# Patient Record
Sex: Male | Born: 1975 | Hispanic: Yes | Marital: Married | Smoking: Never smoker
Health system: Southern US, Community
[De-identification: ages and names within clinical notes are randomized; demographics above are authoritative.]

## PROBLEM LIST (undated history)

## (undated) DIAGNOSIS — E8881 Metabolic syndrome: Secondary | ICD-10-CM

## (undated) DIAGNOSIS — E785 Hyperlipidemia, unspecified: Secondary | ICD-10-CM

## (undated) DIAGNOSIS — K760 Fatty (change of) liver, not elsewhere classified: Secondary | ICD-10-CM

## (undated) HISTORY — DX: Metabolic syndrome: E88.810

## (undated) HISTORY — DX: Metabolic syndrome: E88.81

## (undated) HISTORY — DX: Fatty (change of) liver, not elsewhere classified: K76.0

## (undated) HISTORY — DX: Hyperlipidemia, unspecified: E78.5

---

## 2002-12-25 ENCOUNTER — Emergency Department (HOSPITAL_COMMUNITY): Admission: EM | Admit: 2002-12-25 | Discharge: 2002-12-25 | Payer: Self-pay | Admitting: Emergency Medicine

## 2007-05-20 ENCOUNTER — Emergency Department (HOSPITAL_COMMUNITY): Admission: EM | Admit: 2007-05-20 | Discharge: 2007-05-20 | Payer: Self-pay | Admitting: Emergency Medicine

## 2008-06-02 ENCOUNTER — Encounter: Admission: RE | Admit: 2008-06-02 | Discharge: 2008-06-02 | Payer: Self-pay | Admitting: Family Medicine

## 2008-06-09 ENCOUNTER — Encounter: Admission: RE | Admit: 2008-06-09 | Discharge: 2008-07-20 | Payer: Self-pay | Admitting: Family Medicine

## 2008-08-28 ENCOUNTER — Encounter: Admission: RE | Admit: 2008-08-28 | Discharge: 2008-08-28 | Payer: Self-pay | Admitting: Family Medicine

## 2014-02-27 ENCOUNTER — Encounter (HOSPITAL_COMMUNITY): Payer: Self-pay | Admitting: Emergency Medicine

## 2014-02-27 ENCOUNTER — Emergency Department (HOSPITAL_COMMUNITY)
Admission: EM | Admit: 2014-02-27 | Discharge: 2014-02-27 | Disposition: A | Discharge status: Home / Self Care | Payer: Medicaid Other | Attending: Emergency Medicine | Admitting: Emergency Medicine

## 2014-02-27 ENCOUNTER — Emergency Department (HOSPITAL_COMMUNITY): Payer: Medicaid Other

## 2014-02-27 DIAGNOSIS — K5790 Diverticulosis of intestine, part unspecified, without perforation or abscess without bleeding: Secondary | ICD-10-CM | POA: Diagnosis not present

## 2014-02-27 DIAGNOSIS — K5792 Diverticulitis of intestine, part unspecified, without perforation or abscess without bleeding: Secondary | ICD-10-CM

## 2014-02-27 DIAGNOSIS — R000 Tachycardia, unspecified: Secondary | ICD-10-CM | POA: Diagnosis not present

## 2014-02-27 DIAGNOSIS — R1030 Lower abdominal pain, unspecified: Secondary | ICD-10-CM

## 2014-02-27 LAB — CBC WITH DIFFERENTIAL/PLATELET
BASOS ABS: 0 10*3/uL (ref 0.0–0.1)
Basophils Relative: 0 % (ref 0–1)
EOS PCT: 0 % (ref 0–5)
Eosinophils Absolute: 0 10*3/uL (ref 0.0–0.7)
HCT: 47.6 % (ref 39.0–52.0)
Hemoglobin: 16.8 g/dL (ref 13.0–17.0)
LYMPHS ABS: 0.9 10*3/uL (ref 0.7–4.0)
LYMPHS PCT: 8 % — AB (ref 12–46)
MCH: 32.2 pg (ref 26.0–34.0)
MCHC: 35.3 g/dL (ref 30.0–36.0)
MCV: 91.4 fL (ref 78.0–100.0)
Monocytes Absolute: 0.5 10*3/uL (ref 0.1–1.0)
Monocytes Relative: 5 % (ref 3–12)
NEUTROS PCT: 87 % — AB (ref 43–77)
Neutro Abs: 9.8 10*3/uL — ABNORMAL HIGH (ref 1.7–7.7)
PLATELETS: 150 10*3/uL (ref 150–400)
RBC: 5.21 MIL/uL (ref 4.22–5.81)
RDW: 12.2 % (ref 11.5–15.5)
WBC: 11.2 10*3/uL — AB (ref 4.0–10.5)

## 2014-02-27 LAB — URINALYSIS, ROUTINE W REFLEX MICROSCOPIC
Bilirubin Urine: NEGATIVE
GLUCOSE, UA: NEGATIVE mg/dL
Hgb urine dipstick: NEGATIVE
KETONES UR: NEGATIVE mg/dL
LEUKOCYTES UA: NEGATIVE
NITRITE: NEGATIVE
PH: 6 (ref 5.0–8.0)
Protein, ur: NEGATIVE mg/dL
SPECIFIC GRAVITY, URINE: 1.009 (ref 1.005–1.030)
Urobilinogen, UA: 1 mg/dL (ref 0.0–1.0)

## 2014-02-27 LAB — HEPATIC FUNCTION PANEL
ALBUMIN: 4.6 g/dL (ref 3.5–5.2)
ALK PHOS: 93 U/L (ref 39–117)
ALT: 73 U/L — ABNORMAL HIGH (ref 0–53)
AST: 35 U/L (ref 0–37)
BILIRUBIN TOTAL: 2.8 mg/dL — AB (ref 0.3–1.2)
Bilirubin, Direct: 0.4 mg/dL — ABNORMAL HIGH (ref 0.0–0.3)
Indirect Bilirubin: 2.4 mg/dL — ABNORMAL HIGH (ref 0.3–0.9)
TOTAL PROTEIN: 8.3 g/dL (ref 6.0–8.3)

## 2014-02-27 LAB — LIPASE, BLOOD: LIPASE: 28 U/L (ref 11–59)

## 2014-02-27 LAB — I-STAT CHEM 8, ED
BUN: 10 mg/dL (ref 6–23)
CREATININE: 0.9 mg/dL (ref 0.50–1.35)
Calcium, Ion: 1.13 mmol/L (ref 1.12–1.23)
Chloride: 101 mEq/L (ref 96–112)
Glucose, Bld: 111 mg/dL — ABNORMAL HIGH (ref 70–99)
HCT: 50 % (ref 39.0–52.0)
HEMOGLOBIN: 17 g/dL (ref 13.0–17.0)
POTASSIUM: 3.5 meq/L — AB (ref 3.7–5.3)
SODIUM: 141 meq/L (ref 137–147)
TCO2: 24 mmol/L (ref 0–100)

## 2014-02-27 MED ORDER — OXYCODONE-ACETAMINOPHEN 5-325 MG PO TABS: 2.0000 | ORAL_TABLET | Freq: Four times a day (QID) | ORAL | Status: DC | PRN

## 2014-02-27 MED ORDER — OXYCODONE-ACETAMINOPHEN 5-325 MG PO TABS
2.0000 | ORAL_TABLET | Freq: Once | ORAL | Status: AC
  Administered 2014-02-27: 2 via ORAL

## 2014-02-27 MED ORDER — MORPHINE SULFATE 4 MG/ML IJ SOLN
4.0000 mg | Freq: Once | INTRAMUSCULAR | Status: AC
  Administered 2014-02-27: 4 mg via INTRAVENOUS

## 2014-02-27 MED ORDER — CIPROFLOXACIN IN D5W 400 MG/200ML IV SOLN
400.0000 mg | Freq: Once | INTRAVENOUS | Status: AC
  Administered 2014-02-27: 400 mg via INTRAVENOUS

## 2014-02-27 MED ORDER — IOHEXOL 300 MG/ML  SOLN
100.0000 mL | Freq: Once | INTRAMUSCULAR | Status: AC | PRN
  Administered 2014-02-27: 100 mL via INTRAVENOUS

## 2014-02-27 MED ORDER — SODIUM CHLORIDE 0.9 % IV BOLUS (SEPSIS)
1000.0000 mL | Freq: Once | INTRAVENOUS | Status: AC
  Administered 2014-02-27: 1000 mL via INTRAVENOUS

## 2014-02-27 MED ORDER — METRONIDAZOLE IN NACL 5-0.79 MG/ML-% IV SOLN
500.0000 mg | Freq: Once | INTRAVENOUS | Status: AC
  Administered 2014-02-27: 500 mg via INTRAVENOUS

## 2014-02-27 MED ORDER — ONDANSETRON HCL 4 MG/2ML IJ SOLN
4.0000 mg | Freq: Once | INTRAMUSCULAR | Status: AC
  Administered 2014-02-27: 4 mg via INTRAVENOUS

## 2014-02-27 MED ORDER — IOHEXOL 300 MG/ML  SOLN
50.0000 mL | Freq: Once | INTRAMUSCULAR | Status: AC | PRN
  Administered 2014-02-27: 50 mL via ORAL

## 2014-02-27 MED ORDER — IBUPROFEN 800 MG PO TABS
800.0000 mg | ORAL_TABLET | Freq: Once | ORAL | Status: AC
  Administered 2014-02-27: 800 mg via ORAL

## 2014-02-27 MED ORDER — METRONIDAZOLE 500 MG PO TABS: 500.0000 mg | ORAL_TABLET | Freq: Two times a day (BID) | ORAL | Status: DC

## 2014-02-27 MED ORDER — CIPROFLOXACIN HCL 500 MG PO TABS: 500.0000 mg | ORAL_TABLET | Freq: Two times a day (BID) | ORAL | Status: DC

## 2014-02-27 NOTE — ED Provider Notes (Signed)
CSN: [id number]     Arrival date & time 02/27/14  1323 History   First MD Initiated Contact with Patient 02/27/14 1345     Chief Complaint  Patient presents with  . Abdominal Pain  . sent from PCP to rule out appendicitis      (Consider location/radiation/quality/duration/timing/severity/associated sxs/prior Treatment) HPI   38 year old male who was sent here from PCP office for evaluation of low abdominal pain. Patient reports gradual onset of low abdominal pain for the past 2 days. She described pain as a sharp achy sensation, waxing waning but intensified last night. Pain is severe and he was having difficulty sleeping. He attempted to have a bowel movement with minimal improvement. He felt nauseous but without vomiting. He tries taking ibuprofen which does alleviate his pain. He reports subjective fever, chills, and decrease in appetite. He denies any recent injury. He denies dysuria, penile discharge, scrotal pain, or rash. He was seen at his PCP office this morning and was recommended to come to the ER for further care. No prior history of abdominal surgery.  History reviewed. No pertinent past medical history. History reviewed. No pertinent past surgical history. No family history on file. History  Substance Use Topics  . Smoking status: Never Smoker   . Smokeless tobacco: Not on file  . Alcohol Use: No    Review of Systems  All other systems reviewed and are negative.     Allergies  Review of patient's allergies indicates not on file.  Home Medications   Prior to Admission medications   Not on File   BP 117/76 mmHg  Pulse 107  Temp(Src) 97.5 F (36.4 C) (Oral)  Resp 20  Ht 5\' 4"  (1.626 m)  Wt 195 lb (88.451 kg)  BMI 33.46 kg/m2  SpO2 99% Physical Exam  Constitutional: He appears well-developed and well-nourished. No distress.  HENT:  Head: Atraumatic.  Eyes: Conjunctivae are normal.  Neck: Normal range of motion. Neck supple.  Cardiovascular:   Tachycardia without murmurs rubs or gallops  Pulmonary/Chest: Effort normal and breath sounds normal.  Abdominal: Soft. Bowel sounds are normal. He exhibits no distension. There is tenderness (Suprapubic abdominal tenderness on palpation with guarding but without rebound tenderness). Hernia confirmed negative in the right inguinal area and confirmed negative in the left inguinal area.  Negative Murphy's and no pain at McBurney's point.  Genitourinary: Testes normal and penis normal. Uncircumcised.  No CVA tenderness  Neurological: He is alert.  Skin: No rash noted.  Psychiatric: He has a normal mood and affect.    ED Course  Procedures (including critical care time)  1:56 PM Patient here with intense lower abdominal pain suggestive of appendicitis, colitis, diverticulitis. He reported his urine was checked at his doctor's office and was unremarkable. Will obtain CT scan for further evaluation. Patient is mildly tachycardic this is likely secondary to pain, he is afebrile. However IV fluid given.  3:56 PM Care discussed with oncoming provider who will determine disposition pending CT result.    Labs Review Labs Reviewed  CBC WITH DIFFERENTIAL - Abnormal; Notable for the following:    WBC 11.2 (*)    Neutrophils Relative % 87 (*)    Neutro Abs 9.8 (*)    Lymphocytes Relative 8 (*)    All other components within normal limits  HEPATIC FUNCTION PANEL - Abnormal; Notable for the following:    ALT 73 (*)    Total Bilirubin 2.8 (*)    Bilirubin, Direct 0.4 (*)  Indirect Bilirubin 2.4 (*)    All other components within normal limits  I-STAT CHEM 8, ED - Abnormal; Notable for the following:    Potassium 3.5 (*)    Glucose, Bld 111 (*)    All other components within normal limits  LIPASE, BLOOD  URINALYSIS, ROUTINE W REFLEX MICROSCOPIC  I-STAT CREATININE, ED    Imaging Review Ct Abdomen Pelvis W Contrast  02/27/2014   CLINICAL DATA:  38 year old male with history of right lower  quadrant abdominal pain which began last night, also complaining of diarrhea. Evaluate for potential acute appendicitis.  EXAM: CT ABDOMEN AND PELVIS WITH CONTRAST  TECHNIQUE: Multidetector CT imaging of the abdomen and pelvis was performed using the standard protocol following bolus administration of intravenous contrast.  CONTRAST:  50mL OMNIPAQUE IOHEXOL 300 MG/ML SOLN, 100mL OMNIPAQUE IOHEXOL 300 MG/ML SOLN  COMPARISON:  No priors.  FINDINGS: Lower chest:  Unremarkable.  Hepatobiliary: No cystic or solid hepatic lesions. No intra or extrahepatic biliary ductal dilatation. Gallbladder is normal in appearance.  Pancreas: Unremarkable.  Spleen: Unremarkable.  Adrenals/Urinary Tract: There are 2 low-attenuation lesions in the kidneys, largest of which measures 1.9 cm in the anterior aspect of the lower pole of the right kidney, both of which are compatible with small simple cysts. No hydroureteronephrosis. Urinary bladder is normal in appearance. Bilateral adrenal glands are normal at and appearance.  Stomach/Bowel: Normal appendix. However, there is severe mass-like thickening of the distal sigmoid colon, best appreciated on images 71-75 of series 2, where there is extensive surrounding inflammatory changes in the adjacent sigmoid mesocolon. A few tiny colonic diverticulae are noted in the colon, suggesting that this could be related to acute diverticulitis. No definite diverticular abscess is identified at this time. The appearance of the stomach is normal. No pathologic dilatation of small bowel or colon.  Vascular/Lymphatic: No significant atherosclerotic disease within the abdominal or pelvic vasculature. No aneurysm or dissection. No lymphadenopathy noted in the abdomen or pelvis.  Reproductive: Prostate gland and seminal vesicles are unremarkable in appearance.  Other: Trace volume of ascites, presumably reactive. No pneumoperitoneum to suggest frank perforation of the bowel at this time.  Musculoskeletal:  There are no aggressive appearing lytic or blastic lesions noted in the visualized portions of the skeleton.  IMPRESSION: 1. Area of masslike thickening and surrounding inflammatory changes in the distal sigmoid colon. Given the presence of several diverticulae in this region, this is favored to represent an acute diverticulitis. This could alternatively be a focal area of severe colitis. However, given the mass like appearance of this region, the possibility of underlying neoplasm is not entirely excluded, and correlation with followup nonemergent colonoscopy after the patient's acute symptoms resolve following treatment is recommended in the near future. 2. Normal appendix. 3. Trace volume of ascites, presumably reactive. No diverticular abscess or signs of frank perforation of the bowel are noted at this time.   Electronically Signed   By: Trudie Reedaniel  Entrikin M.D.   On: 02/27/2014 16:07     EKG Interpretation None      MDM   Final diagnoses:  Lower abdominal pain  Diverticulitis of intestine without perforation or abscess without bleeding    BP 118/73 mmHg  Pulse 96  Temp(Src) 98.9 F (37.2 C) (Oral)  Resp 20  Ht 5\' 4"  (1.626 m)  Wt 195 lb (88.451 kg)  BMI 33.46 kg/m2  SpO2 95%  I have reviewed nursing notes and vital signs. I personally reviewed the imaging tests through PACS system  I reviewed available ER/hospitalization records thought the EMR     Fayrene HelperBowie Kekoa Fyock, Cordelia Poche-C 02/28/14 [id number]  Raeford RazorStephen Kohut, MD 03/06/14 [id number]

## 2014-02-27 NOTE — ED Provider Notes (Signed)
3:51 PM Patient signed out to me by Ardelle Parkran, PA-C.    Patient with RLQ pain since last night.  Se[facility] at [facility] and sent to ED for CT scan.  Plan:  CT abd.  Dispo pending CT results.  4:44 PM CT remarkable for probable acute diverticulitis.  Will treat with cipro/flagyl.  Will reassess.  If feeling better, patient can go home with GI follow-up and colonoscopy.  Discussed with Dr. Juleen ChinaKohut, who agrees with the plan.  Results for orders placed or performed during the hospital encounter of 02/27/14  CBC with Differential  Result Value Ref Range   WBC 11.2 (H) 4.0 - 10.5 K/uL   RBC 5.21 4.22 - 5.81 MIL/uL   Hemoglobin 16.8 13.0 - 17.0 g/dL   HCT 16.147.6 09.639.0 - 04.552.0 %   MCV 91.4 78.0 - 100.0 fL   MCH 32.2 26.0 - 34.0 pg   MCHC 35.3 30.0 - 36.0 g/dL   RDW 40.912.2 81.111.5 - 91.415.5 %   Platelets 150 150 - 400 K/uL   Neutrophils Relative % 87 (H) 43 - 77 %   Neutro Abs 9.8 (H) 1.7 - 7.7 K/uL   Lymphocytes Relative 8 (L) 12 - 46 %   Lymphs Abs 0.9 0.7 - 4.0 K/uL   Monocytes Relative 5 3 - 12 %   Monocytes Absolute 0.5 0.1 - 1.0 K/uL   Eosinophils Relative 0 0 - 5 %   Eosinophils Absolute 0.0 0.0 - 0.7 K/uL   Basophils Relative 0 0 - 1 %   Basophils Absolute 0.0 0.0 - 0.1 K/uL  Hepatic function panel  Result Value Ref Range   Total Protein 8.3 6.0 - 8.3 g/dL   Albumin 4.6 3.5 - 5.2 g/dL   AST 35 0 - 37 U/L   ALT 73 (H) 0 - 53 U/L   Alkaline Phosphatase 93 39 - 117 U/L   Total Bilirubin 2.8 (H) 0.3 - 1.2 mg/dL   Bilirubin, Direct 0.4 (H) 0.0 - 0.3 mg/dL   Indirect Bilirubin 2.4 (H) 0.3 - 0.9 mg/dL  Lipase, blood  Result Value Ref Range   Lipase 28 11 - 59 U/L  Urinalysis, Routine w reflex microscopic  Result Value Ref Range   Color, Urine YELLOW YELLOW   APPearance CLEAR CLEAR   Specific Gravity, Urine 1.009 1.005 - 1.030   pH 6.0 5.0 - 8.0   Glucose, UA NEGATIVE NEGATIVE mg/dL   Hgb urine dipstick NEGATIVE NEGATIVE   Bilirubin Urine NEGATIVE NEGATIVE   Ketones, ur NEGATIVE NEGATIVE mg/dL    Protein, ur NEGATIVE NEGATIVE mg/dL   Urobilinogen, UA 1.0 0.0 - 1.0 mg/dL   Nitrite NEGATIVE NEGATIVE   Leukocytes, UA NEGATIVE NEGATIVE   Ct Abdomen Pelvis W Contrast  02/27/2014   CLINICAL DATA:  38 year old male with history of right lower quadrant abdominal pain which began last night, also complaining of diarrhea. Evaluate for potential acute appendicitis.  EXAM: CT ABDOMEN AND PELVIS WITH CONTRAST  TECHNIQUE: Multidetector CT imaging of the abdomen and pelvis was performed using the standard protocol following bolus administration of intravenous contrast.  CONTRAST:  50mL OMNIPAQUE IOHEXOL 300 MG/ML SOLN, 100mL OMNIPAQUE IOHEXOL 300 MG/ML SOLN  COMPARISON:  No priors.  FINDINGS: Lower chest:  Unremarkable.  Hepatobiliary: No cystic or solid hepatic lesions. No intra or extrahepatic biliary ductal dilatation. Gallbladder is normal in appearance.  Pancreas: Unremarkable.  Spleen: Unremarkable.  Adrenals/Urinary Tract: There are 2 low-attenuation lesions in the kidneys, largest of which measures 1.9 cm in the anterior  aspect of the lower pole of the right kidney, both of which are compatible with small simple cysts. No hydroureteronephrosis. Urinary bladder is normal in appearance. Bilateral adrenal glands are normal at and appearance.  Stomach/Bowel: Normal appendix. However, there is severe mass-like thickening of the distal sigmoid colon, best appreciated on images 71-75 of series 2, where there is extensive surrounding inflammatory changes in the adjacent sigmoid mesocolon. A few tiny colonic diverticulae are noted in the colon, suggesting that this could be related to acute diverticulitis. No definite diverticular abscess is identified at this time. The appearance of the stomach is normal. No pathologic dilatation of small bowel or colon.  Vascular/Lymphatic: No significant atherosclerotic disease within the abdominal or pelvic vasculature. No aneurysm or dissection. No lymphadenopathy noted in the  abdomen or pelvis.  Reproductive: Prostate gland and seminal vesicles are unremarkable in appearance.  Other: Trace volume of ascites, presumably reactive. No pneumoperitoneum to suggest frank perforation of the bowel at this time.  Musculoskeletal: There are no aggressive appearing lytic or blastic lesions noted in the visualized portions of the skeleton.  IMPRESSION: 1. Area of masslike thickening and surrounding inflammatory changes in the distal sigmoid colon. Given the presence of several diverticulae in this region, this is favored to represent an acute diverticulitis. This could alternatively be a focal area of severe colitis. However, given the mass like appearance of this region, the possibility of underlying neoplasm is not entirely excluded, and correlation with followup nonemergent colonoscopy after the patient's acute symptoms resolve following treatment is recommended in the near future. 2. Normal appendix. 3. Trace volume of ascites, presumably reactive. No diverticular abscess or signs of frank perforation of the bowel are noted at this time.   Electronically Signed   By: Trudie Reedaniel  Entrikin M.D.   On: 02/27/2014 16:07    9:01 PM Patient has finished abx.  Reassessed multiple times.  Feeling well.  Tolerating oral intake.  Pain has resolved.  Plan for discharge to home as above.  Strict return precautions given.  9:21 PM Patient has low-grade temp.  Will give ibuprofen.  If this resolves, discharge as above.  Filed Vitals:   02/27/14 2151  BP:   Pulse:   Temp: 98.9 F (37.2 C)  Resp:      Roxy HorsemanRobert Torri Michalski, PA-C 02/27/14 2205  Raeford RazorStephen Kohut, MD 03/06/14 [phone]

## 2014-02-27 NOTE — Discharge Instructions (Signed)
If your symptoms worsen, please return to the ER.  Diverticulitis Diverticulitis is when small pockets that have formed in your colon (large intestine) become infected or swollen. HOME CARE  Follow your doctor's instructions.  Follow a special diet if told by your doctor.  When you feel better, your doctor may tell you to change your diet. You may be told to eat a lot of fiber. Fruits and vegetables are good sources of fiber. Fiber makes it easier to poop (have bowel movements).  Take supplements or probiotics as told by your doctor.  Only take medicines as told by your doctor.  Keep all follow-up visits with your doctor. GET HELP IF:  Your pain does not get better.  You have a hard time eating food.  You are not pooping like normal. GET HELP RIGHT AWAY IF:  Your pain gets worse.  Your problems do not get better.  Your problems suddenly get worse.  You have a fever.  You keep throwing up (vomiting).  You have bloody or black, tarry poop (stool). MAKE SURE YOU: Diverticulitis (Diverticulitis) La diverticulitis es la inflamacin o infeccin de pequeas bolsas en el colon que se forman por una afeccin llamada diverticulosis. Las bolsas en el colon se denominan divertculos. El colon, o intestino grueso, es el lugar donde se absorbe agua y se forman las heces. Entre las complicaciones de la diverticulitis, se incluyen: Hemorragias. Infeccin grave. Dolor intenso. Perforacin del colon. Obstruccin del colon. CAUSAS  La diverticulitis est causada por bacterias y La diverticulitis se produce cuando queda materia fecal retenida en los divertculos. Esto permite que crezcan bacterias en los divertculos, lo que puede llevar a la inflamacin e infeccin. FACTORES DE RIESGO Las personas con diverticulosis corren riesgo de presentar diverticulitis. Las dietas que no incluyen suficiente fibra proveniente de frutas y Sports administratorvegetales pueden predisponer a la diverticulitis. SNTOMAS    Entre los sntomas de diverticulitis, se incluyen: Dolor y Associate Professormolestias en el abdomen. El dolor en general aparece en el lado izquierdo del abdomen, pero puede aparecer en otras zonas. Fiebre o escalofros Hinchazn. Clicos. Nuseas. Vmitos. Estreimiento. Diarrea. Sangre en la materia fecal. DIAGNSTICO  El mdico le preguntar acerca de sus antecedentes mdicos y le har un examen fsico. Es posible que le hagan estudios, porque hay muchas enfermedades que pueden causar los mismos sntomas que la diverticulitis. Los estudios pueden incluir: [place]. Anlisis de [place]. Estudios por imgenes del abdomen, entre ellos, radiografas y tomografas computarizadas. Cuando la afeccin est controlada, el mdico puede recomendarle que se haga una colonoscopa. La colonoscopa puede mostrar la gravedad de los divertculos y si hay algo ms que est causando los sntomas. TRATAMIENTO  La mayora de los casos de diverticulitis son leves y pueden tratarse Facilities manageren el hogar. El tratamiento puede incluir: Tomar medicamentos para el dolor de [place] libre. Seguir una dieta lquida absoluta. Tomar antibiticos por va oral durante 7a 10das. Los casos ms graves pueden tratarse [employer] el hospital. El tratamiento puede incluir: No comer ni beber nada. Tomar medicamentos para el dolor recetados. Recibir antibiticos a travs de una va IV. Recibir lquidos y nutricin a travs de una va IV. Ciruga. INSTRUCCIONES PARA EL CUIDADO EN EL HOGAR  Siga cuidadosamente las indicaciones del mdico. Siga las indicaciones del mdico acerca de si debe consumir una dieta lquida o de otro tipo. Cuando los sntomas mejoren, el mdico puede indicarle que modifique la dieta y recomendarle que consuma una dieta con alto contenido de Adamsfibra. Las frutas y los vegetales  son buenas fuentes de Guyanafibra. La fibra facilita la eliminacin de heces. Tome los suplementos con fibra o los probiticos segn las indicaciones del  mdico. Tome los medicamentos solamente como se lo haya indicado el mdico. Cumpla con todas las visitas de control. SOLICITE ATENCIN MDICA SI:  El dolor no mejora. Le resulta difcil alimentarse. Los movimientos intestinales no se normalizan. SOLICITE ATENCIN MDICA DE INMEDIATO SI:  El dolor empeora. Los sntomas no mejoran. Los sntomas empeoran repentinamente. Tiene fiebre. Ha vomitado repetidas veces. La materia fecal es sanguinolenta o negra, de aspecto alquitranado. ASEGRESE DE QUE:  Comprende estas instrucciones. Controlar su afeccin. Recibir ayuda de inmediato si no mejora o si empeora. Document Released: 12/21/2004 Document Revised: 03/18/2013 [facility] Patient Information 2015 [employer], [place]. This information is not intended to replace advice given to you by your health care provider. Make sure you discuss any questions you have with your health care provider.   Understand these instructions.  Will watch your condition.  Will get help right away if you are not doing well or get worse. Document Released: 08/30/2007 Document Revised: 03/18/2013 Document Reviewed: 02/05/2013 [facility] Patient Information 2015 [place], [place]. This information is not intended to replace advice given to you by your health care provider. Make sure you discuss any questions you have with your health care provider.

## 2014-02-27 NOTE — ED Notes (Signed)
MD at bedside. EDPA ROBERT TO EVALUATE THIS PT

## 2014-02-27 NOTE — ED Notes (Addendum)
Pt c/o lower abd pain that started last night. Pt also having diarrhea.  Pt went to PCP today adn was sent here for scan to rule out appendicitis. They did blood work and urine test. Pt was sent at [facility].

## 2014-04-02 ENCOUNTER — Encounter: Payer: Self-pay | Admitting: *Deleted

## 2015-09-04 IMAGING — CT CT ABD-PELV W/ CM
1 of 2 series · 14 of 32 positions shown, 18 images · IV contrast (OMNIPAQUE 300)
Comparison: No priors.

CLINICAL DATA: 38-year-old male with history of right lower
quadrant abdominal pain which began last night, also complaining of
diarrhea. Evaluate for potential acute appendicitis.

EXAM:
CT ABDOMEN AND PELVIS WITH CONTRAST
TECHNIQUE: Multidetector CT imaging of the abdomen and pelvis was performed
using the standard protocol following bolus administration of
intravenous contrast.
CONTRAST:  50mL OMNIPAQUE IOHEXOL 300 MG/ML SOLN, 100mL OMNIPAQUE
IOHEXOL 300 MG/ML SOLN

[Series 2: abd/pel with · axial · 0.87mm/px · z∈[-399,+31]mm · 14 of 98 slices shown, 18 images]
[im 8/98  soft-tissue]
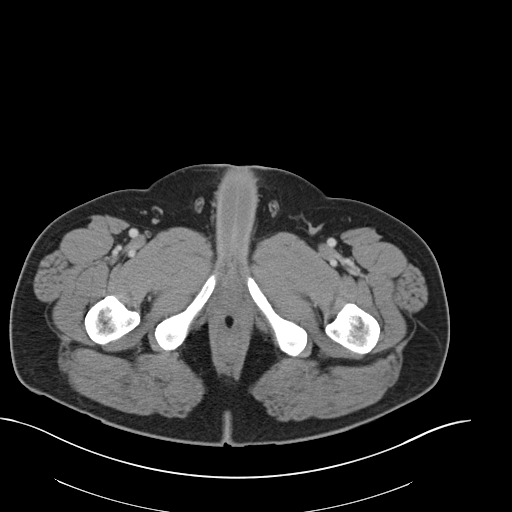
[im 8/98  bone]
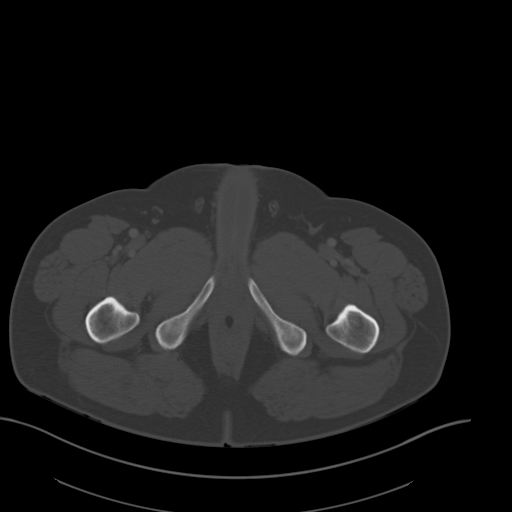
[im 15/98  soft-tissue]
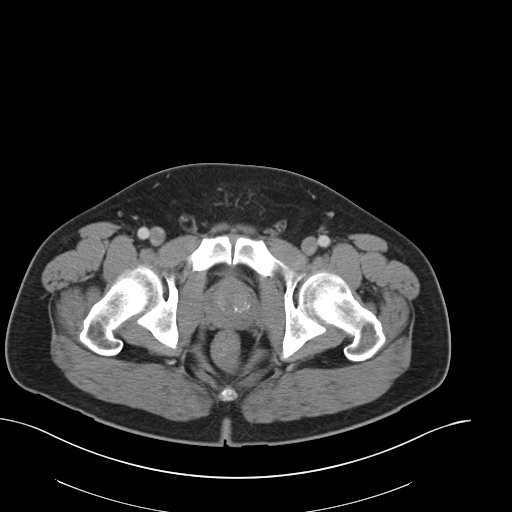
[im 23/98  soft-tissue]
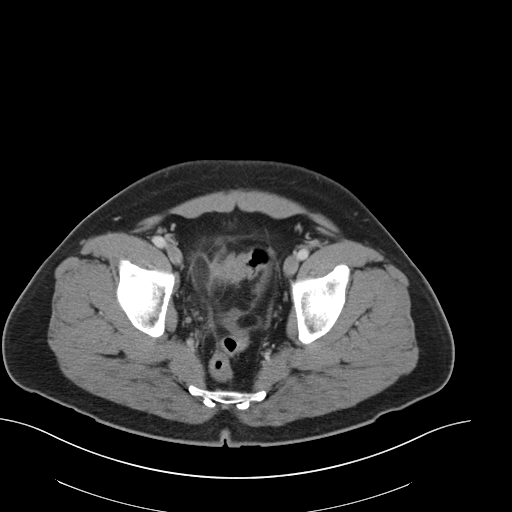
[im 30/98  soft-tissue]
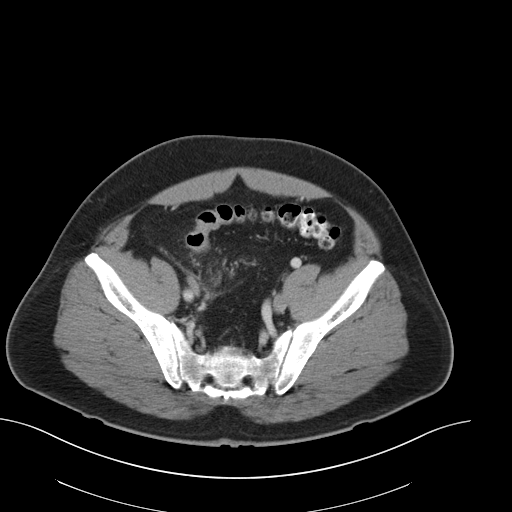
[im 38/98  soft-tissue]
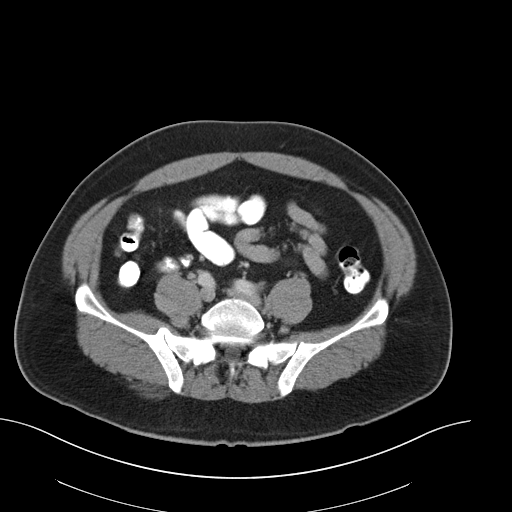
[im 45/98  soft-tissue]
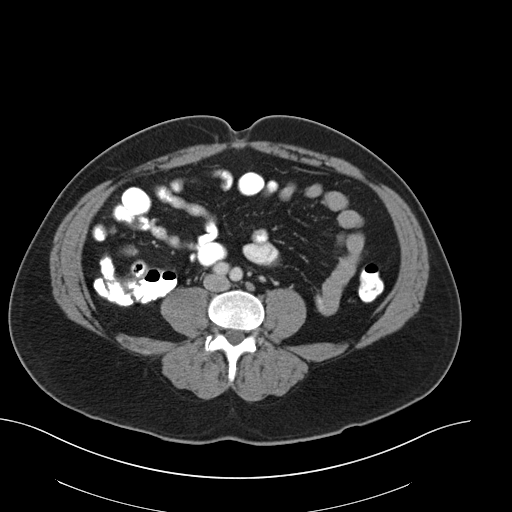
[im 53/98  soft-tissue]
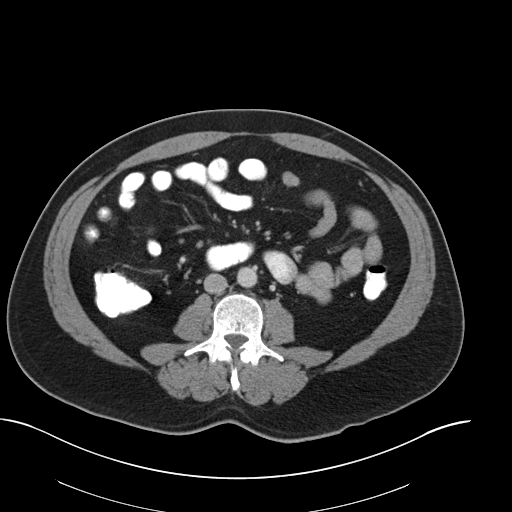
[im 60/98  soft-tissue]
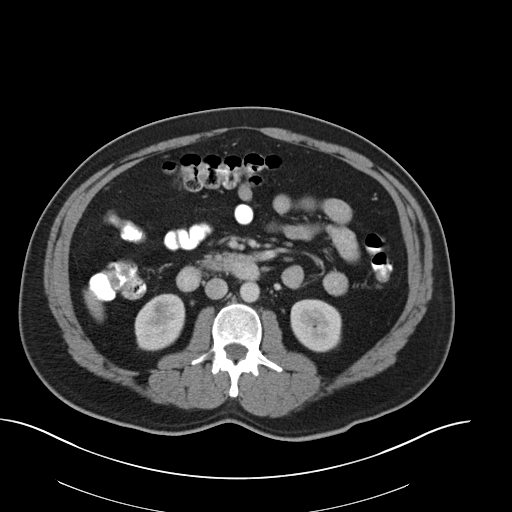
[im 68/98  soft-tissue]
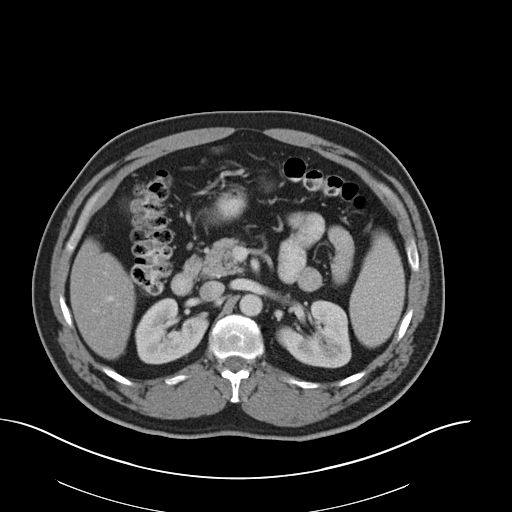
[im 68/98  bone]
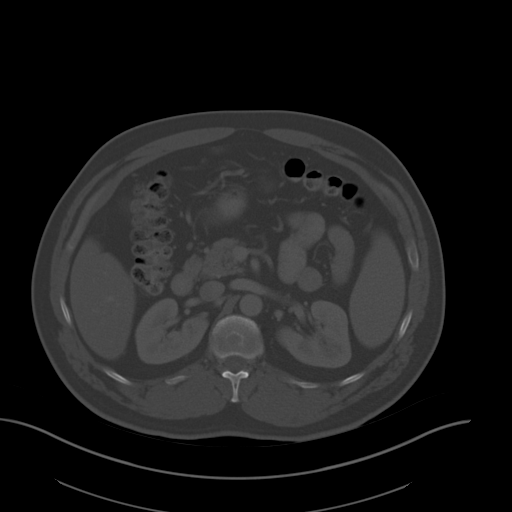
[im 75/98  soft-tissue]
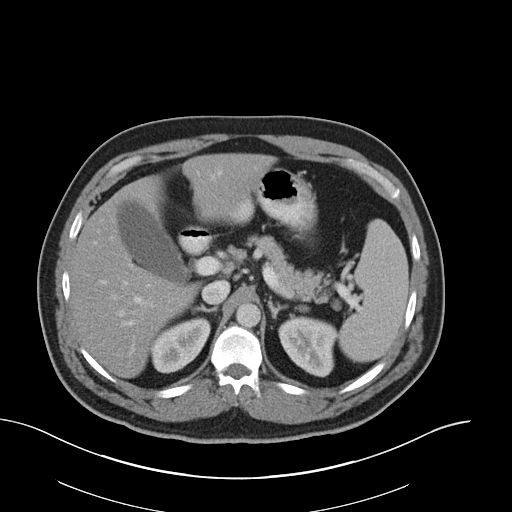
[im 83/98  soft-tissue]
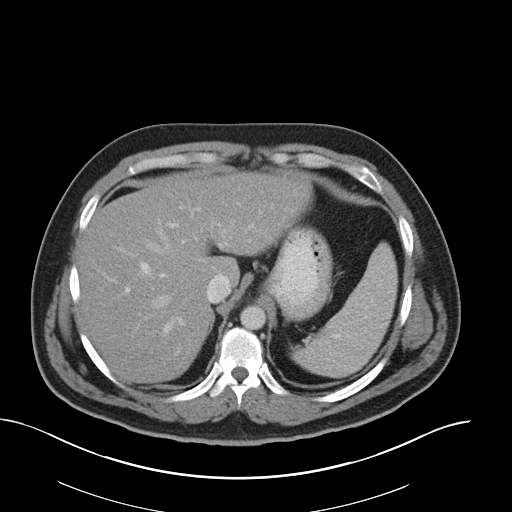
[im 83/98  lung]
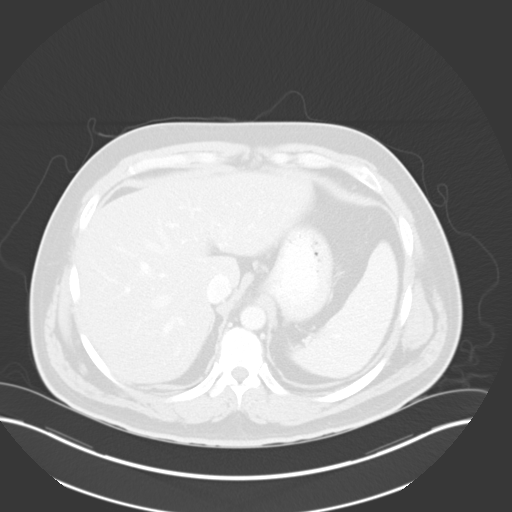
[im 86/98  lung]
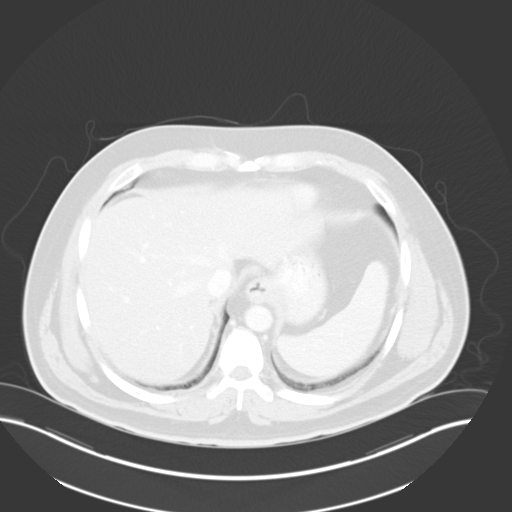
[im 90/98  soft-tissue]
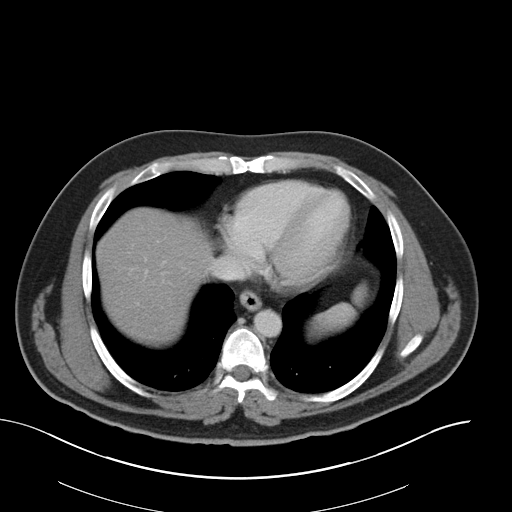
[im 90/98  lung]
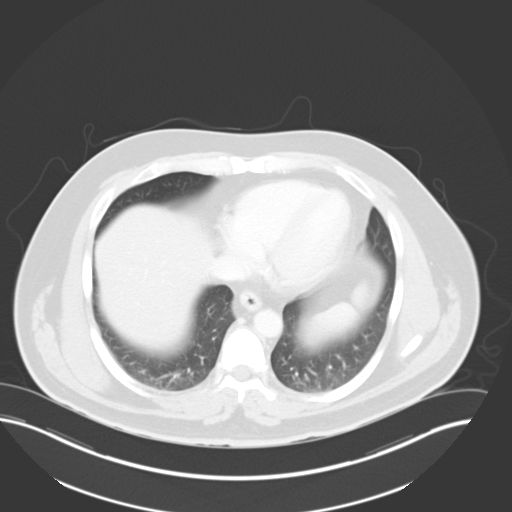
[im 94/98  lung]
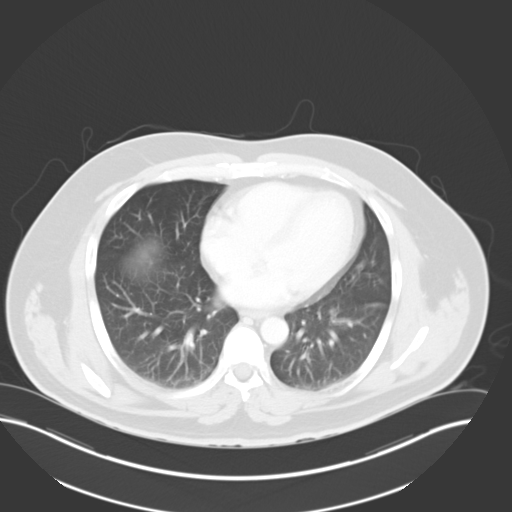

[14 of 32 positions shown; findings below may reference images not displayed]

FINDINGS: Lower chest:  Unremarkable.

Hepatobiliary: No cystic or solid hepatic lesions. No intra or
extrahepatic biliary ductal dilatation. Gallbladder is normal in
appearance.

Pancreas: Unremarkable.

Spleen: Unremarkable.

Adrenals/Urinary Tract: There are 2 low-attenuation lesions in the
kidneys, largest of which measures 1.9 cm in the anterior aspect of
the lower pole of the right kidney, both of which are compatible
with small simple cysts. No hydroureteronephrosis. Urinary bladder
is normal in appearance. Bilateral adrenal glands are normal at and
appearance.

Stomach/Bowel: Normal appendix. However, there is severe mass-like
thickening of the distal sigmoid colon, best appreciated on images
71-75 of series 2, where there is extensive surrounding inflammatory
changes in the adjacent sigmoid mesocolon. A few tiny colonic
diverticulae are noted in the colon, suggesting that this could be
related to acute diverticulitis. No definite diverticular abscess is
identified at this time. The appearance of the stomach is normal. No
pathologic dilatation of small bowel or colon.

Vascular/Lymphatic: No significant atherosclerotic disease within
the abdominal or pelvic vasculature. No aneurysm or dissection. No
lymphadenopathy noted in the abdomen or pelvis.

Reproductive: Prostate gland and seminal vesicles are unremarkable
in appearance.

Other: Trace volume of ascites, presumably reactive. No
pneumoperitoneum to suggest frank perforation of the bowel at this
time.

Musculoskeletal: There are no aggressive appearing lytic or blastic
lesions noted in the visualized portions of the skeleton.
IMPRESSION: 1. Area of masslike thickening and surrounding inflammatory changes
in the distal sigmoid colon. Given the presence of several
diverticulae in this region, this is favored to represent an acute
diverticulitis. This could alternatively be a focal area of severe
colitis. However, given the mass like appearance of this region, the
possibility of underlying neoplasm is not entirely excluded, and
correlation with followup nonemergent colonoscopy after the
patient's acute symptoms resolve following treatment is recommended
in the near future.
2. Normal appendix.
3. Trace volume of ascites, presumably reactive. No diverticular
abscess or signs of frank perforation of the bowel are noted at this
time.

## 2021-01-12 ENCOUNTER — Other Ambulatory Visit: Payer: Self-pay

## 2021-01-12 ENCOUNTER — Inpatient Hospital Stay (HOSPITAL_BASED_OUTPATIENT_CLINIC_OR_DEPARTMENT_OTHER)
Admission: EM | Admit: 2021-01-12 | Discharge: 2021-01-14 | DRG: 392 | Disposition: A | Discharge status: Home / Self Care | Payer: Self-pay | Attending: Internal Medicine | Admitting: Internal Medicine

## 2021-01-12 ENCOUNTER — Encounter (HOSPITAL_BASED_OUTPATIENT_CLINIC_OR_DEPARTMENT_OTHER): Payer: Self-pay | Admitting: Emergency Medicine

## 2021-01-12 ENCOUNTER — Emergency Department (HOSPITAL_BASED_OUTPATIENT_CLINIC_OR_DEPARTMENT_OTHER): Payer: Self-pay

## 2021-01-12 DIAGNOSIS — E1165 Type 2 diabetes mellitus with hyperglycemia: Secondary | ICD-10-CM | POA: Diagnosis present

## 2021-01-12 DIAGNOSIS — K5720 Diverticulitis of large intestine with perforation and abscess without bleeding: Principal | ICD-10-CM | POA: Diagnosis present

## 2021-01-12 DIAGNOSIS — Z6833 Body mass index (BMI) 33.0-33.9, adult: Secondary | ICD-10-CM

## 2021-01-12 DIAGNOSIS — E669 Obesity, unspecified: Secondary | ICD-10-CM | POA: Diagnosis present

## 2021-01-12 DIAGNOSIS — L0291 Cutaneous abscess, unspecified: Secondary | ICD-10-CM

## 2021-01-12 DIAGNOSIS — E785 Hyperlipidemia, unspecified: Secondary | ICD-10-CM | POA: Diagnosis present

## 2021-01-12 DIAGNOSIS — E8881 Metabolic syndrome: Secondary | ICD-10-CM | POA: Diagnosis present

## 2021-01-12 DIAGNOSIS — K5732 Diverticulitis of large intestine without perforation or abscess without bleeding: Secondary | ICD-10-CM

## 2021-01-12 DIAGNOSIS — R7303 Prediabetes: Secondary | ICD-10-CM

## 2021-01-12 DIAGNOSIS — Z8249 Family history of ischemic heart disease and other diseases of the circulatory system: Secondary | ICD-10-CM

## 2021-01-12 DIAGNOSIS — Z79899 Other long term (current) drug therapy: Secondary | ICD-10-CM

## 2021-01-12 DIAGNOSIS — Z20822 Contact with and (suspected) exposure to covid-19: Secondary | ICD-10-CM | POA: Diagnosis present

## 2021-01-12 DIAGNOSIS — K760 Fatty (change of) liver, not elsewhere classified: Secondary | ICD-10-CM | POA: Diagnosis present

## 2021-01-12 DIAGNOSIS — K5792 Diverticulitis of intestine, part unspecified, without perforation or abscess without bleeding: Secondary | ICD-10-CM | POA: Diagnosis present

## 2021-01-12 DIAGNOSIS — Z833 Family history of diabetes mellitus: Secondary | ICD-10-CM

## 2021-01-12 LAB — COMPREHENSIVE METABOLIC PANEL WITH GFR
ALT: 40 U/L (ref 0–44)
AST: 29 U/L (ref 15–41)
Albumin: 4.3 g/dL (ref 3.5–5.0)
Alkaline Phosphatase: 83 U/L (ref 38–126)
Anion gap: 8 (ref 5–15)
BUN: 15 mg/dL (ref 6–20)
CO2: 27 mmol/L (ref 22–32)
Calcium: 9.2 mg/dL (ref 8.9–10.3)
Chloride: 102 mmol/L (ref 98–111)
Creatinine, Ser: 0.68 mg/dL (ref 0.61–1.24)
GFR, Estimated: >60 mL/min
Glucose, Bld: 234 mg/dL — ABNORMAL HIGH (ref 70–99)
Potassium: 3.4 mmol/L — ABNORMAL LOW (ref 3.5–5.1)
Sodium: 137 mmol/L (ref 135–145)
Total Bilirubin: 1.4 mg/dL — ABNORMAL HIGH (ref 0.3–1.2)
Total Protein: 7.4 g/dL (ref 6.5–8.1)

## 2021-01-12 LAB — URINALYSIS, ROUTINE W REFLEX MICROSCOPIC
Bilirubin Urine: NEGATIVE
Glucose, UA: >1000 mg/dL — AB
Hgb urine dipstick: NEGATIVE
Ketones, ur: NEGATIVE mg/dL
Leukocytes,Ua: NEGATIVE
Nitrite: NEGATIVE
Protein, ur: NEGATIVE mg/dL
Specific Gravity, Urine: 1.026 (ref 1.005–1.030)
pH: 5.5 (ref 5.0–8.0)

## 2021-01-12 LAB — CBC
HCT: 44.3 % (ref 39.0–52.0)
Hemoglobin: 15.7 g/dL (ref 13.0–17.0)
MCH: 31.2 pg (ref 26.0–34.0)
MCHC: 35.4 g/dL (ref 30.0–36.0)
MCV: 87.9 fL (ref 80.0–100.0)
Platelets: 191 10*3/uL (ref 150–400)
RBC: 5.04 MIL/uL (ref 4.22–5.81)
RDW: 11.3 % — ABNORMAL LOW (ref 11.5–15.5)
WBC: 9 10*3/uL (ref 4.0–10.5)
nRBC: 0 % (ref 0.0–0.2)

## 2021-01-12 LAB — RESP PANEL BY RT-PCR (FLU A&B, COVID) ARPGX2
Influenza A by PCR: NEGATIVE
Influenza B by PCR: NEGATIVE
SARS Coronavirus 2 by RT PCR: NEGATIVE

## 2021-01-12 LAB — LIPASE, BLOOD: Lipase: 40 U/L (ref 11–51)

## 2021-01-12 MED ORDER — IOHEXOL 300 MG/ML  SOLN
100.0000 mL | Freq: Once | INTRAMUSCULAR | Status: AC | PRN
  Administered 2021-01-12: 100 mL via INTRAVENOUS

## 2021-01-12 MED ORDER — ONDANSETRON HCL 4 MG/2ML IJ SOLN
4.0000 mg | Freq: Once | INTRAMUSCULAR | Status: AC
  Administered 2021-01-12: 4 mg via INTRAVENOUS
  Filled 2021-01-12: qty 2

## 2021-01-12 MED ORDER — SODIUM CHLORIDE 0.9 % IV SOLN
2.0000 g | Freq: Once | INTRAVENOUS | Status: AC
  Administered 2021-01-12: 2 g via INTRAVENOUS
  Filled 2021-01-12: qty 20

## 2021-01-12 MED ORDER — METRONIDAZOLE 500 MG/100ML IV SOLN
500.0000 mg | Freq: Once | INTRAVENOUS | Status: AC
  Administered 2021-01-12: 500 mg via INTRAVENOUS
  Filled 2021-01-12: qty 100

## 2021-01-12 MED ORDER — MORPHINE SULFATE (PF) 4 MG/ML IV SOLN
4.0000 mg | Freq: Once | INTRAVENOUS | Status: AC
  Administered 2021-01-12: 4 mg via INTRAVENOUS
  Filled 2021-01-12: qty 1

## 2021-01-12 NOTE — ED Provider Notes (Signed)
[facility] EMERGENCY DEPT Provider Note   CSN: [id number] Arrival date & time: 01/12/21  1802     History Chief Complaint  Patient presents with   Abdominal Pain    Christopher Baldwin is a 45 y.o. male.  HPI  Patient presents with abdominal pain.  The abdominal pain started acutely on Saturday, its been constant since then.  Initially was on the left side of his lower abdomen, it is moved to the right.  He does endorse feeling some pressure with urination, denies any blood in the urine.  He has tried Tylenol without any relief.  Touching the abdomen appears to be an aggravating factor.  No prior abdominal surgeries.  Denies any nausea, vomiting, diarrhea.  Past Medical History:  Diagnosis Date   Dyslipidemia    Fatty liver    Metabolic syndrome     Patient Active Problem List   Diagnosis Date Noted   Acute diverticulitis 01/12/2021    History reviewed. No pertinent surgical history.     Family History  Family history unknown: Yes    Social History   Tobacco Use   Smoking status: Never  Substance Use Topics   Alcohol use: No   Drug use: No    Home Medications Prior to Admission medications   Medication Sig Start Date End Date Taking? Authorizing Provider  ciprofloxacin (CIPRO) 500 MG tablet Take 1 tablet (500 mg total) by mouth 2 (two) times daily. 02/27/14   Roxy Horseman, PA-C  ibuprofen (ADVIL,MOTRIN) 800 MG tablet Take 800 mg by mouth every 8 (eight) hours as needed for moderate pain.    [provider]  lamoTRIgine (LAMICTAL) 25 MG tablet Take 25 mg by mouth 2 (two) times daily.    [provider]  metroNIDAZOLE (FLAGYL) 500 MG tablet Take 1 tablet (500 mg total) by mouth 2 (two) times daily. 02/27/14   Roxy Horseman, PA-C  oxyCODONE-acetaminophen (PERCOCET/ROXICET) 5-325 MG per tablet Take 2 tablets by mouth every 6 (six) hours as needed for severe pain. 02/27/14   Roxy Horseman, PA-C    Allergies    Patient has no  known allergies.  Review of Systems   Review of Systems  Constitutional:  Negative for fever.  HENT:  Negative for congestion.   Respiratory:  Negative for shortness of breath.   Cardiovascular:  Negative for chest pain.  Gastrointestinal:  Positive for abdominal pain. Negative for nausea and vomiting.  Genitourinary:  Positive for dysuria. Negative for hematuria.  Musculoskeletal:  Negative for back pain.  Neurological:  Negative for light-headedness.   Physical Exam Updated Vital Signs BP (!) 151/103   Pulse 77   Temp 98.2 F (36.8 C) (Oral)   Resp 20   Ht 5\' 4"  (1.626 m)   Wt 88.5 kg   SpO2 100%   BMI 33.49 kg/m   Physical Exam Vitals and nursing note reviewed. Exam conducted with a chaperone present.  Constitutional:      Appearance: Normal appearance.     Comments: Uncomfortable appearing  HENT:     Head: Normocephalic and atraumatic.  Eyes:     General: No scleral icterus.       Right eye: No discharge.        Left eye: No discharge.     Extraocular Movements: Extraocular movements intact.     Pupils: Pupils are equal, round, and reactive to light.  Cardiovascular:     Rate and Rhythm: Normal rate and regular rhythm.     Pulses: Normal pulses.  Heart sounds: Normal heart sounds. No murmur heard.   No friction rub. No gallop.  Pulmonary:     Effort: Pulmonary effort is normal. No respiratory distress.     Breath sounds: Normal breath sounds.  Abdominal:     General: Abdomen is flat. Bowel sounds are normal. There is no distension.     Palpations: Abdomen is soft.     Tenderness: There is abdominal tenderness in the right lower quadrant.  Skin:    General: Skin is warm and dry.     Coloration: Skin is not jaundiced.  Neurological:     Mental Status: He is alert. Mental status is at baseline.     Coordination: Coordination normal.   ED Results / Procedures / Treatments   Labs (all labs ordered are listed, but only abnormal results are displayed) Labs  Reviewed  COMPREHENSIVE METABOLIC PANEL - Abnormal; Notable for the following components:      Result Value   Potassium 3.4 (*)    Glucose, Bld 234 (*)    Total Bilirubin 1.4 (*)    All other components within normal limits  CBC - Abnormal; Notable for the following components:   RDW 11.3 (*)    All other components within normal limits  URINALYSIS, ROUTINE W REFLEX MICROSCOPIC - Abnormal; Notable for the following components:   Glucose, UA >1,000 (*)    All other components within normal limits  RESP PANEL BY RT-PCR (FLU A&B, COVID) ARPGX2  LIPASE, BLOOD  HEMOGLOBIN A1C    EKG None  Radiology CT Abdomen Pelvis W Contrast  Result Date: 01/12/2021 CLINICAL DATA:  Right lower quadrant pain. EXAM: CT ABDOMEN AND PELVIS WITH CONTRAST TECHNIQUE: Multidetector CT imaging of the abdomen and pelvis was performed using the standard protocol following bolus administration of intravenous contrast. CONTRAST:  OMNIPAQUE IOHEXOL 300 MG/ML  SOLN COMPARISON:  CT abdomen and pelvis 02/27/2014. FINDINGS: Lower chest: No acute abnormality. Hepatobiliary: No focal liver abnormality is seen. No gallstones, gallbladder wall thickening, or biliary dilatation. Pancreas: Unremarkable. No pancreatic ductal dilatation or surrounding inflammatory changes. Spleen: Normal in size without focal abnormality. Adrenals/Urinary Tract: There are rounded cortical hypodensities in the right kidney which are too small to characterize, but likely cysts. Kidneys and adrenal glands are otherwise within normal limits. There is mild diffuse bladder wall thickening versus normal under distension. There is no surrounding inflammation. Stomach/Bowel: Sigmoid colon diverticula are present. There is sigmoid colon wall thickening with surrounding inflammatory stranding and fluid. Posterior to the distal sigmoid colon there is an enhancing fluid collection containing air measuring 2.8 x 2.5 x 2.5 cm concerning for abscess. No free air  identified. No bowel obstruction. Appendix within normal limits. Small bowel and stomach within normal limits. Vascular/Lymphatic: No significant vascular findings are present. No enlarged abdominal or pelvic lymph nodes. Reproductive: Prostate gland is mildly enlarged. Other: No ascites.  No focal abdominal wall hernia. Musculoskeletal: No acute or significant osseous findings. IMPRESSION: 1. Acute sigmoid colon diverticulitis. 2.8 cm adjacent extraluminal abscess. 2. Bladder wall thickening versus normal under distension. Correlate for cystitis. 3. Prostatomegaly. Electronically Signed   By: Darliss Cheney M.D.   On: 01/12/2021 19:54    Procedures Procedures   Medications Ordered in ED Medications  cefTRIAXone (ROCEPHIN) 2 g in sodium chloride 0.9 % 100 mL IVPB (2 g Intravenous New Bag/Given 01/12/21 2136)    And  metroNIDAZOLE (FLAGYL) IVPB 500 mg (has no administration in time range)  iohexol (OMNIPAQUE) 300 MG/ML solution 100 mL (100  mLs Intravenous Contrast Given 01/12/21 1920)  morphine 4 MG/ML injection 4 mg (4 mg Intravenous Given 01/12/21 2133)  ondansetron (ZOFRAN) injection 4 mg (4 mg Intravenous Given 01/12/21 2133)    ED Course  I have reviewed the triage vital signs and the nursing notes.  Pertinent labs & imaging results that were available during my care of the patient were reviewed by me and considered in my medical decision making (see chart for details).    MDM Rules/Calculators/A&P                           Patient has a sigmoid diverticulitis with 2.8 cm abscess formation.  We will start him on Rocephin and Flagyl, will consult general surgery for their recommendations.    Patient does not have a history of diabetes, he has a random blood glucose greater than 200 and glucosuria greater than 1000.  We will get an A1c.  Patient meets criteria for new diagnosis diabetes, according to spouse he has been prediabetic for over a year.  Spoke with Dr. Freida Busman with general  surgery.  She advises admit the patient to the hospital service at [facility] and they will consult.  We will proceed with hospital admission.  Final Clinical Impression(s) / ED Diagnoses Final diagnoses:  Sigmoid diverticulitis  Abscess    Rx / DC Orders ED Discharge Orders     None        Theron Arista, Cordelia Poche 01/12/21 2148    Milagros Loll, MD 01/13/21 1743

## 2021-01-12 NOTE — ED Triage Notes (Signed)
Reports RLQ abdominal pain since Saturday.  Also endorses some pelvic pain.  Does endorse burning with urination.  Also reports he has some painful "bumps" to his lower back they want checked out as well.

## 2021-01-12 NOTE — ED Notes (Signed)
Called [facility] to transport patient to [facility] room [phone]

## 2021-01-13 ENCOUNTER — Encounter (HOSPITAL_COMMUNITY): Payer: Self-pay | Admitting: Family Medicine

## 2021-01-13 DIAGNOSIS — R7303 Prediabetes: Secondary | ICD-10-CM

## 2021-01-13 DIAGNOSIS — K5792 Diverticulitis of intestine, part unspecified, without perforation or abscess without bleeding: Secondary | ICD-10-CM

## 2021-01-13 LAB — GLUCOSE, CAPILLARY
Glucose-Capillary: 168 mg/dL — ABNORMAL HIGH (ref 70–99)
Glucose-Capillary: 158 mg/dL — ABNORMAL HIGH (ref 70–99)
Glucose-Capillary: 196 mg/dL — ABNORMAL HIGH (ref 70–99)
Glucose-Capillary: 100 mg/dL — ABNORMAL HIGH (ref 70–99)

## 2021-01-13 LAB — CBC
HCT: 41.1 % (ref 39.0–52.0)
Hemoglobin: 14.2 g/dL (ref 13.0–17.0)
MCH: 31.4 pg (ref 26.0–34.0)
MCHC: 34.5 g/dL (ref 30.0–36.0)
MCV: 90.9 fL (ref 80.0–100.0)
Platelets: 167 10*3/uL (ref 150–400)
RBC: 4.52 MIL/uL (ref 4.22–5.81)
RDW: 11.7 % (ref 11.5–15.5)
WBC: 7.4 10*3/uL (ref 4.0–10.5)
nRBC: 0 % (ref 0.0–0.2)

## 2021-01-13 LAB — COMPREHENSIVE METABOLIC PANEL
ALT: 37 U/L (ref 0–44)
AST: 26 U/L (ref 15–41)
Albumin: 3.6 g/dL (ref 3.5–5.0)
Alkaline Phosphatase: 70 U/L (ref 38–126)
Anion gap: 5 (ref 5–15)
BUN: 13 mg/dL (ref 6–20)
CO2: 28 mmol/L (ref 22–32)
Calcium: 8.3 mg/dL — ABNORMAL LOW (ref 8.9–10.3)
Chloride: 104 mmol/L (ref 98–111)
Creatinine, Ser: 0.69 mg/dL (ref 0.61–1.24)
GFR, Estimated: >60 mL/min (ref >60)
Glucose, Bld: 191 mg/dL — ABNORMAL HIGH (ref 70–99)
Potassium: 3.1 mmol/L — ABNORMAL LOW (ref 3.5–5.1)
Sodium: 137 mmol/L (ref 135–145)
Total Bilirubin: 1.3 mg/dL — ABNORMAL HIGH (ref 0.3–1.2)
Total Protein: 6.6 g/dL (ref 6.5–8.1)

## 2021-01-13 LAB — HEMOGLOBIN A1C
Hgb A1c MFr Bld: 9.9 % — ABNORMAL HIGH (ref 4.8–5.6)
Mean Plasma Glucose: 237 mg/dL

## 2021-01-13 LAB — HIV ANTIBODY (ROUTINE TESTING W REFLEX): HIV Screen 4th Generation wRfx: NONREACTIVE

## 2021-01-13 MED ORDER — HYDROMORPHONE HCL 1 MG/ML IJ SOLN: 0.5000 mg | INTRAMUSCULAR | Status: DC | PRN

## 2021-01-13 MED ORDER — ACETAMINOPHEN 325 MG PO TABS
650.0000 mg | ORAL_TABLET | Freq: Four times a day (QID) | ORAL | Status: DC | PRN
  Administered 2021-01-13 (×2): 650 mg via ORAL
  Filled 2021-01-13 (×2): qty 2

## 2021-01-13 MED ORDER — SODIUM CHLORIDE 0.9 % IV SOLN
2.0000 g | INTRAVENOUS | Status: DC
  Administered 2021-01-13: 2 g via INTRAVENOUS
  Filled 2021-01-13 (×2): qty 20

## 2021-01-13 MED ORDER — METRONIDAZOLE 500 MG/100ML IV SOLN
500.0000 mg | Freq: Two times a day (BID) | INTRAVENOUS | Status: DC
  Administered 2021-01-13 – 2021-01-14 (×3): 500 mg via INTRAVENOUS
  Filled 2021-01-13 (×3): qty 100

## 2021-01-13 MED ORDER — POLYETHYLENE GLYCOL 3350 17 G PO PACK: 17.0000 g | PACK | Freq: Every day | ORAL | Status: DC | PRN

## 2021-01-13 MED ORDER — ENOXAPARIN SODIUM 40 MG/0.4ML IJ SOSY
40.0000 mg | PREFILLED_SYRINGE | INTRAMUSCULAR | Status: DC
  Administered 2021-01-13 – 2021-01-14 (×2): 40 mg via SUBCUTANEOUS
  Filled 2021-01-13 (×2): qty 0.4

## 2021-01-13 MED ORDER — ACETAMINOPHEN 650 MG RE SUPP: 650.0000 mg | Freq: Four times a day (QID) | RECTAL | Status: DC | PRN

## 2021-01-13 MED ORDER — SODIUM CHLORIDE 0.9 % IV SOLN: INTRAVENOUS | Status: AC

## 2021-01-13 MED ORDER — SODIUM CHLORIDE 0.9% FLUSH: 3.0000 mL | Freq: Two times a day (BID) | INTRAVENOUS | Status: DC

## 2021-01-13 MED ORDER — INSULIN ASPART 100 UNIT/ML IJ SOLN
0.0000 [IU] | Freq: Three times a day (TID) | INTRAMUSCULAR | Status: DC
Start: 2021-01-13 — End: 2021-01-14
  Administered 2021-01-13 – 2021-01-14 (×4): 2 [IU] via SUBCUTANEOUS
  Administered 2021-01-14: 1 [IU] via SUBCUTANEOUS

## 2021-01-13 MED ORDER — POTASSIUM CHLORIDE IN NACL 20-0.45 MEQ/L-% IV SOLN
INTRAVENOUS | Status: DC
  Filled 2021-01-13 (×3): qty 1000

## 2021-01-13 NOTE — Progress Notes (Signed)
Initial Nutrition Assessment  DOCUMENTATION CODES:   Obesity unspecified  INTERVENTION:   -Will monitor for diet advancement  -Reviewed diet with patient, provide diet handouts in spanish in discharge instructions  NUTRITION DIAGNOSIS:   Increased nutrient needs related to acute illness as evidenced by estimated needs.  GOAL:   Patient will meet greater than or equal to 90% of their needs  MONITOR:   PO intake, Labs, Weight trends, I & O's  REASON FOR ASSESSMENT:   Malnutrition Screening Tool    ASSESSMENT:   45 y.o. male with medical history significant of hyperlipidemia, fatty liver, diverticulitis who presents with 4 days of abdominal pain.  Patient reports eating well with good appetite prior to symptom onset on 10/15. Pt was having abdominal pain but was able to keep food down.  On clears today and is tolerating them, he didn't like his soup. Pt with history of diverticulitis flare in 2016, was not advised as far as diet. Reviewed low fiber diet with pt and how to advance to high fiber to prevent flare-ups long-term. Answered all of pt's diet related questions. Will place handouts in spanish in discharge instructions.  Per weight records, pt weighed 190 lbs in 2020.  Current weight recorded as 195 lbs.   Medications reviewed.  Labs reviewed: CBGs: 158-168 Low K   NUTRITION - FOCUSED PHYSICAL EXAM:  No depletions noted.  Diet Order:   Diet Order             Diet clear liquid Room service appropriate? Yes; Fluid consistency: Thin  Diet effective now                   EDUCATION NEEDS:   Education needs have been addressed  Skin:  Skin Assessment: Reviewed RN Assessment  Last BM:  10/18  Height:   Ht Readings from Last 1 Encounters:  01/12/21 5\' 4"  (1.626 m)    Weight:   Wt Readings from Last 1 Encounters:  01/12/21 88.5 kg    BMI:  Body mass index is 33.49 kg/m.  Estimated Nutritional Needs:   Kcal:  1700-1900  Protein:   75-90g  Fluid:  1.9L/day  01/14/21, MS, RD, LDN Inpatient Clinical Dietitian Contact information available via Amion

## 2021-01-13 NOTE — Consult Note (Signed)
[employer] 06-03-1975  [id number].    Requesting MD: Dr. Jarvis Newcomer Chief Complaint/Reason for Consult: Diverticulitis  HPI: Christopher Baldwin is a 45 y.o. male w/ a hx of hyperlipidemia and prior episode of diverticulitis who presented to the ED at [facility] for abdominal pain.  Patient reports that [place] was [place] when he started developing lower abdominal pain that felt like "gas pains".  He reports that the pain became constant and was moderate to severe Saturday night into Sunday, located in the right lower quadrant with occasional radiation to the left lower quadrant.  He notes that he has been having hard stools that seem to make his pain in his abdomen worse when they pass.  Last BM 2 days ago. He continues to pass flatus. He tried Tylenol and Aleve without relief.  Notes associated subjective fever and chills.  No nausea, vomiting or diarrhea. His pain has improved since Sunday when the pain peaked and is currently mild to moderate but remains constant. He reports that his he reports this is similar to his pain in 2015 when he was first diagnosed with diverticulitis.  He did see a gastroenterologist in [place] in 2016 but deferred colonoscopy at that time.  No family history of colon cancer or IBD.  No prior abdominal surgeries.  He is not on any anticoagulation medicine.  ROS: Review of Systems  Constitutional:  Positive for chills and fever.  Gastrointestinal:  Positive for abdominal pain and constipation. Negative for blood in stool, melena, nausea and vomiting.  Psychiatric/Behavioral:  Negative for substance abuse.   All other systems reviewed and are negative.  Family History  Problem Relation Age of Onset   Hypertension Father    Diabetes Father     Past Medical History:  Diagnosis Date   Dyslipidemia    Fatty liver    Metabolic syndrome     History reviewed. No pertinent surgical  history.  Social History:  reports that he has never smoked. He does not have any smokeless tobacco history on file. He reports that he does not drink alcohol and does not use drugs. Patient works in [employer].  He lives at home with his wife and 2 children.  He denies any alcohol, tobacco or illicit drug use.  Allergies: No Known Allergies  Medications Prior to Admission  Medication Sig Dispense Refill   ibuprofen (ADVIL,MOTRIN) 800 MG tablet Take 800 mg by mouth every 8 (eight) hours as needed for moderate pain.     lamoTRIgine (LAMICTAL) 25 MG tablet Take 25 mg by mouth 2 (two) times daily. (Patient not taking: Reported on 01/13/2021)       Physical Exam: Blood pressure 124/83, pulse 70, temperature 98.9 F (37.2 C), temperature source Oral, resp. rate 16, height 5\' 4"  (1.626 m), weight 88.5 kg, SpO2 100 %. General: pleasant, WD/WN male who is laying in bed in NAD HEENT: head is normocephalic, atraumatic.  Sclera are noninjected.  PERRL.  Ears and nose without any masses or lesions.  Mouth is pink and moist. Dentition fair Heart: regular, rate, and rhythm.  Normal s1,s2. No obvious murmurs, gallops, or rubs noted.  Palpable pedal pulses bilaterally  Lungs: CTAB, no wheezes, rhonchi, or rales noted.  Respiratory effort nonlabored Abd:  Soft, ND, mild tenderness of the RLQ>suprapubic abdomen without peritonitis. +BS, no masses, hernias, or organomegaly. No prior abdominal surgical scars.  MS: no BUE/BLE edema, calves soft and nontender  Skin: warm and dry with no masses, lesions, or rashes Psych: A&Ox4 with an appropriate affect Neuro: cranial nerves grossly intact, equal strength in BUE/BLE bilaterally, normal speech, thought process intact, moves all extremities, gait not assessed   Results for orders placed or performed during the hospital encounter of 01/12/21 (from the past 48 hour(s))  Lipase, blood     Status: None   Collection Time: 01/12/21  6:21 PM  Result Value Ref Range    Lipase 40 11 - 51 U/L    Comment: Performed at [facility], [address], [place], [place] [address]  Comprehensive metabolic panel     Status: Abnormal   Collection Time: 01/12/21  6:21 PM  Result Value Ref Range   Sodium 137 135 - 145 mmol/L   Potassium 3.4 (L) 3.5 - 5.1 mmol/L   Chloride 102 98 - 111 mmol/L   CO2 27 22 - 32 mmol/L   Glucose, Bld 234 (H) 70 - 99 mg/dL    Comment: Glucose reference range applies only to samples taken after fasting for at least 8 hours.   BUN 15 6 - 20 mg/dL   Creatinine, Ser 0.34 0.61 - 1.24 mg/dL   Calcium 9.2 8.9 - 74.2 mg/dL   Total Protein 7.4 6.5 - 8.1 g/dL   Albumin 4.3 3.5 - 5.0 g/dL   AST 29 15 - 41 U/L   ALT 40 0 - 44 U/L   Alkaline Phosphatase 83 38 - 126 U/L   Total Bilirubin 1.4 (H) 0.3 - 1.2 mg/dL   GFR, Estimated >59 >56 mL/min    Comment: (NOTE) Calculated using the CKD-EPI Creatinine Equation (2021)    Anion gap 8 5 - 15    Comment: Performed at [facility], [address] [place], [place] [address]  CBC     Status: Abnormal   Collection Time: 01/12/21  6:21 PM  Result Value Ref Range   WBC 9.0 4.0 - 10.5 K/uL   RBC 5.04 4.22 - 5.81 MIL/uL   Hemoglobin 15.7 13.0 - 17.0 g/dL   HCT 43.3 29.5 - 18.8 %   MCV 87.9 80.0 - 100.0 fL   MCH 31.2 26.0 - 34.0 pg   MCHC 35.4 30.0 - 36.0 g/dL   RDW 41.6 (L) 60.6 - 30.1 %   Platelets 191 150 - 400 K/uL   nRBC 0.0 0.0 - 0.2 %    Comment: Performed at [facility], [address], [place], [place] [address]  Urinalysis, Routine w reflex microscopic Urine, Clean Catch     Status: Abnormal   Collection Time: 01/12/21  6:21 PM  Result Value Ref Range   Color, Urine YELLOW YELLOW   APPearance CLEAR CLEAR   Specific Gravity, Urine 1.026 1.005 - 1.030   pH 5.5 5.0 - 8.0   Glucose, UA >1,000 (A) NEGATIVE mg/dL   Hgb urine dipstick NEGATIVE NEGATIVE   Bilirubin Urine NEGATIVE NEGATIVE   Ketones, ur NEGATIVE NEGATIVE mg/dL    Protein, ur NEGATIVE NEGATIVE mg/dL   Nitrite NEGATIVE NEGATIVE   Leukocytes,Ua NEGATIVE NEGATIVE   WBC, UA 0-5 0 - 5 WBC/hpf   Mucus PRESENT     Comment: Performed at [facility], [address] [place], [place] [address]  Resp Panel by RT-PCR (Flu A&B, Covid) Nasopharyngeal Swab     Status: None   Collection Time: 01/12/21  9:40 PM   Specimen: Nasopharyngeal Swab; Nasopharyngeal(NP) swabs in vial transport medium  Result Value Ref Range  SARS Coronavirus 2 by RT PCR NEGATIVE NEGATIVE    Comment: (NOTE) SARS-CoV-2 target nucleic acids are NOT DETECTED.  The SARS-CoV-2 RNA is generally detectable in upper respiratory specimens during the acute phase of infection. The lowest concentration of SARS-CoV-2 viral copies this assay can detect is 138 copies/mL. A negative result does not preclude SARS-Cov-2 infection and should not be used as the sole basis for treatment or other patient management decisions. A negative result may occur with  improper specimen collection/handling, submission of specimen other than nasopharyngeal swab, presence of viral mutation(s) within the areas targeted by this assay, and inadequate number of viral copies(<138 copies/mL). A negative result must be combined with clinical observations, patient history, and epidemiological information. The expected result is Negative.  Fact Sheet for Patients:  BloggerCourse.com  Fact Sheet for Healthcare Providers:  SeriousBroker.it  This test is no t yet approved or cleared by the [place] FDA and  has been authorized for detection and/or diagnosis of SARS-CoV-2 by FDA under an Emergency Use Authorization (EUA). This EUA will remain  in effect (meaning this test can be used) for the duration of the COVID-19 declaration under Section 564(b)(1) of the Act, 21 U.S.C.section 360bbb-3(b)(1), unless the authorization is terminated  or revoked  sooner.       Influenza A by PCR NEGATIVE NEGATIVE   Influenza B by PCR NEGATIVE NEGATIVE    Comment: (NOTE) The Xpert Xpress SARS-CoV-2/FLU/RSV plus assay is intended as an aid in the diagnosis of influenza from Nasopharyngeal swab specimens and should not be used as a sole basis for treatment. Nasal washings and aspirates are unacceptable for Xpert Xpress SARS-CoV-2/FLU/RSV testing.  Fact Sheet for Patients: BloggerCourse.com  Fact Sheet for Healthcare Providers: SeriousBroker.it  This test is not yet approved or cleared by the [place] FDA and has been authorized for detection and/or diagnosis of SARS-CoV-2 by FDA under an Emergency Use Authorization (EUA). This EUA will remain in effect (meaning this test can be used) for the duration of the COVID-19 declaration under Section 564(b)(1) of the Act, 21 U.S.C. section 360bbb-3(b)(1), unless the authorization is terminated or revoked.  Performed at [facility], [address] [place], [place] [address]   CBC     Status: None   Collection Time: 01/13/21  4:26 AM  Result Value Ref Range   WBC 7.4 4.0 - 10.5 K/uL   RBC 4.52 4.22 - 5.81 MIL/uL   Hemoglobin 14.2 13.0 - 17.0 g/dL   HCT 20.2 54.2 - 70.6 %   MCV 90.9 80.0 - 100.0 fL   MCH 31.4 26.0 - 34.0 pg   MCHC 34.5 30.0 - 36.0 g/dL   RDW 23.7 62.8 - 31.5 %   Platelets 167 150 - 400 K/uL   nRBC 0.0 0.0 - 0.2 %    Comment: Performed at [facility], [address], [place], [place] [address]  Comprehensive metabolic panel     Status: Abnormal   Collection Time: 01/13/21  4:26 AM  Result Value Ref Range   Sodium 137 135 - 145 mmol/L   Potassium 3.1 (L) 3.5 - 5.1 mmol/L   Chloride 104 98 - 111 mmol/L   CO2 28 22 - 32 mmol/L   Glucose, Bld 191 (H) 70 - 99 mg/dL    Comment: Glucose reference range applies only to samples taken after fasting for at least 8 hours.   BUN 13 6 - 20  mg/dL   Creatinine, Ser 0.73 0.61 - 1.24 mg/dL  Calcium 8.3 (L) 8.9 - 10.3 mg/dL   Total Protein 6.6 6.5 - 8.1 g/dL   Albumin 3.6 3.5 - 5.0 g/dL   AST 26 15 - 41 U/L   ALT 37 0 - 44 U/L   Alkaline Phosphatase 70 38 - 126 U/L   Total Bilirubin 1.3 (H) 0.3 - 1.2 mg/dL   GFR, Estimated >13 >24 mL/min    Comment: (NOTE) Calculated using the CKD-EPI Creatinine Equation (2021)    Anion gap 5 5 - 15    Comment: Performed at [facility] [address], [place], [place] [address]  Glucose, capillary     Status: Abnormal   Collection Time: 01/13/21  7:42 AM  Result Value Ref Range   Glucose-Capillary 168 (H) 70 - 99 mg/dL    Comment: Glucose reference range applies only to samples taken after fasting for at least 8 hours.   CT Abdomen Pelvis W Contrast  Result Date: 01/12/2021 CLINICAL DATA:  Right lower quadrant pain. EXAM: CT ABDOMEN AND PELVIS WITH CONTRAST TECHNIQUE: Multidetector CT imaging of the abdomen and pelvis was performed using the standard protocol following bolus administration of intravenous contrast. CONTRAST:  OMNIPAQUE IOHEXOL 300 MG/ML  SOLN COMPARISON:  CT abdomen and pelvis 02/27/2014. FINDINGS: Lower chest: No acute abnormality. Hepatobiliary: No focal liver abnormality is seen. No gallstones, gallbladder wall thickening, or biliary dilatation. Pancreas: Unremarkable. No pancreatic ductal dilatation or surrounding inflammatory changes. Spleen: Normal in size without focal abnormality. Adrenals/Urinary Tract: There are rounded cortical hypodensities in the right kidney which are too small to characterize, but likely cysts. Kidneys and adrenal glands are otherwise within normal limits. There is mild diffuse bladder wall thickening versus normal under distension. There is no surrounding inflammation. Stomach/Bowel: Sigmoid colon diverticula are present. There is sigmoid colon wall thickening with surrounding inflammatory stranding and fluid. Posterior to  the distal sigmoid colon there is an enhancing fluid collection containing air measuring 2.8 x 2.5 x 2.5 cm concerning for abscess. No free air identified. No bowel obstruction. Appendix within normal limits. Small bowel and stomach within normal limits. Vascular/Lymphatic: No significant vascular findings are present. No enlarged abdominal or pelvic lymph nodes. Reproductive: Prostate gland is mildly enlarged. Other: No ascites.  No focal abdominal wall hernia. Musculoskeletal: No acute or significant osseous findings. IMPRESSION: 1. Acute sigmoid colon diverticulitis. 2.8 cm adjacent extraluminal abscess. 2. Bladder wall thickening versus normal under distension. Correlate for cystitis. 3. Prostatomegaly. Electronically Signed   By: Darliss Cheney M.D.   On: 01/12/2021 19:54    Anti-infectives (From admission, onward)    Start     Dose/Rate Route Frequency Ordered Stop   01/13/21 2100  cefTRIAXone (ROCEPHIN) 2 g in sodium chloride 0.9 % 100 mL IVPB        2 g 200 mL/hr over 30 Minutes Intravenous Every 24 hours 01/13/21 0027     01/13/21 0900  metroNIDAZOLE (FLAGYL) IVPB 500 mg        500 mg 100 mL/hr over 60 Minutes Intravenous Every 12 hours 01/13/21 0027     01/12/21 2115  cefTRIAXone (ROCEPHIN) 2 g in sodium chloride 0.9 % 100 mL IVPB       See Hyperspace for full Linked Orders Report.   2 g 200 mL/hr over 30 Minutes Intravenous  Once 01/12/21 2109 01/12/21 2215   01/12/21 2115  metroNIDAZOLE (FLAGYL) IVPB 500 mg       See Hyperspace for full Linked Orders Report.   500 mg 100 mL/hr  over 60 Minutes Intravenous  Once 01/12/21 2109 01/12/21 2315       Assessment/Plan Sigmoid diverticulitis w/ abscess - CT 10/19 w/ sigmoid diverticulitis with a 2.8 cm adjacent extraluminal abscess - WBC wnl and patient with mild tenderness on exam. He reports his pain has been improving. No indication for emergency surgery. It does not appear that his abscess is amenable to IR drainage given  location/size - Will allow CLD - Agree with IV abx - Hopefully will improve with conservative therapies.  Discussed if he was improved with conservative therapies would recommend a colonoscopy in 6 weeks.  We did discuss the possibility if he was to worsen he may require repeat CT, IR drainage and/or possible surgery that would likely result in a colectomy.  FEN - CLD, IVF VTE - SCDs, Lovenox ID - Rocephin/Flagyl   Jacinto Halim, [facility] 01/13/2021, 8:46 AM Please see Amion for pager number during day hours 7:00am-4:30pm

## 2021-01-13 NOTE — Progress Notes (Signed)
PROGRESS NOTE  Brief Narrative: Christopher Baldwin is a 45 y.o. male with a history of obesity and an episode of diverticulitis who presented to the ED 10/19 with abdominal pain found to have acute sigmoid diverticulitis with 2.8cm extraluminal abscess. IV antibiotics and fluids were started and the patient was admitted to medicine with surgical consultation early this morning by Dr. Alinda Money.  Subjective: Abdominal pain significantly improved with medications, has tolerated liquids.   Objective: BP 124/79 (BP Location: Right Arm)   Pulse 66   Temp 98.1 F (36.7 C) (Oral)   Resp 18   Ht 5\' 4"  (1.626 m)   Wt 88.5 kg   SpO2 100%   BMI 33.49 kg/m   Gen: Nontoxic Pulm: Clear and nonlabored on room air  CV: RRR, no murmur, no JVD, no edema GI: Soft, mildly tender in RLQ > LLQ, ND, +BS  Neuro: Alert and oriented. No focal deficits. Skin: No rashes, lesions or ulcers  Assessment & Plan: Acute sigmoid diverticulitis complicated by small (2.8cm) abscess:  - Continue antibiotics, hopeful that no surgery will be needed. Not amenable to IR drain.  - Continue IVF, advance diet as tolerated.  - Pt fully aware of need for colonoscopy in ~6 weeks.  History of prediabetes, current hyperglycemia:  - HbA1c is pending still.   , MD Pager on amion 01/13/2021, 2:53 PM

## 2021-01-13 NOTE — H&P (Signed)
History and Physical   Christopher Baldwin IOE:[id number] DOB: Jul 30, 1975 DOA: 01/12/2021  PCP: Mila Palmer, MD   Patient coming from: Home  Chief Complaint: Abdominal pain  HPI: Christopher Baldwin is a 45 y.o. male with medical history significant of hyperlipidemia, fatty liver, diverticulitis who presents with 4 days of abdominal pain.  Patient, as above, presents with abdominal pain for 4 days ago starting on his left side and now seems to move to his right (lower).  Pain has been constant.  The pain did not improve with Tylenol at home.  He also describes a pressure sensation to urinate.  When asked about those lab results his wife reports that he has been prediabetic for about a year.  He denies fever, chills, chest pain, short of breath, dumping, constipation, diarrhea, nausea, vomiting.   ED Course: Vital signs in the ED were stable initially blood pressure was elevated 150s with this is improved to normal.  Lab work-up showed CMP with potassium 3.4, glucose 234, T bili 1.4.  CBC within normal limits.  Lipase normal.  A1c pending.  Respiratory panel flu COVID-negative.  Urinalysis showed glucose greater than 1000.  CT of the abdomen pelvis showed acute sigmoid diverticulitis with a 2.8 cm extraluminal abscess.  Also noted or gallbladder wall thickening versus under distended bladder and prostate enlargement.  Patient received ceftriaxone and Flagyl in the ED as well as some pain medication and nausea medication.  Review of Systems: As per HPI otherwise all other systems reviewed and are negative.  Past Medical History:  Diagnosis Date   Dyslipidemia    Fatty liver    Metabolic syndrome     History reviewed. No pertinent surgical history.  Social History  reports that he has never smoked. He does not have any smokeless tobacco history on file. He reports that he does not drink alcohol and does not use drugs.  No Known Allergies  Family History  Problem Relation Age of Onset    Hypertension Father    Diabetes Father   Reviewed on admission  Prior to Admission medications   Medication Sig Start Date End Date Taking? Authorizing Provider  ibuprofen (ADVIL,MOTRIN) 800 MG tablet Take 800 mg by mouth every 8 (eight) hours as needed for moderate pain.    [provider]  lamoTRIgine (LAMICTAL) 25 MG tablet Take 25 mg by mouth 2 (two) times daily.    [provider]  oxyCODONE-acetaminophen (PERCOCET/ROXICET) 5-325 MG per tablet Take 2 tablets by mouth every 6 (six) hours as needed for severe pain. 02/27/14   Roxy Horseman, PA-C    Physical Exam: Vitals:   01/12/21 2215 01/12/21 2230 01/12/21 2245 01/12/21 2350  BP: 134/88 129/82 126/88 (!) 141/91  Pulse: 88 85 72 72  Resp: 18 18 18 18   Temp:      TempSrc:      SpO2: 99% 99% 98% 99%  Weight:      Height:       Physical Exam Constitutional:      General: He is not in acute distress.    Appearance: Normal appearance.  HENT:     Head: Normocephalic and atraumatic.     Mouth/Throat:     Mouth: Mucous membranes are moist.     Pharynx: Oropharynx is clear.  Eyes:     Extraocular Movements: Extraocular movements intact.     Pupils: Pupils are equal, round, and reactive to light.  Cardiovascular:     Rate and Rhythm: Normal rate and regular rhythm.  Pulses: Normal pulses.     Heart sounds: Normal heart sounds.  Pulmonary:     Effort: Pulmonary effort is normal. No respiratory distress.     Breath sounds: Normal breath sounds.  Abdominal:     General: Bowel sounds are normal. There is no distension.     Palpations: Abdomen is soft.     Tenderness: There is abdominal tenderness.  Musculoskeletal:        General: No swelling or deformity.  Skin:    General: Skin is warm and dry.  Neurological:     General: No focal deficit present.     Mental Status: Mental status is at baseline.   Labs on Admission: I have personally reviewed following labs and imaging studies  CBC: Recent Labs   Lab 01/12/21 1821  WBC 9.0  HGB 15.7  HCT 44.3  MCV 87.9  PLT 191    Basic Metabolic Panel: Recent Labs  Lab 01/12/21 1821  NA 137  K 3.4*  CL 102  CO2 27  GLUCOSE 234*  BUN 15  CREATININE 0.68  CALCIUM 9.2    GFR: Estimated Creatinine Clearance: 115.7 mL/min (by C-G formula based on SCr of 0.68 mg/dL).  Liver Function Tests: Recent Labs  Lab 01/12/21 1821  AST 29  ALT 40  ALKPHOS 83  BILITOT 1.4*  PROT 7.4  ALBUMIN 4.3    Urine analysis:    Component Value Date/Time   COLORURINE YELLOW 01/12/2021 1821   APPEARANCEUR CLEAR 01/12/2021 1821   LABSPEC 1.026 01/12/2021 1821   PHURINE 5.5 01/12/2021 1821   GLUCOSEU >1,000 (A) 01/12/2021 1821   HGBUR NEGATIVE 01/12/2021 1821   BILIRUBINUR NEGATIVE 01/12/2021 1821   KETONESUR NEGATIVE 01/12/2021 1821   PROTEINUR NEGATIVE 01/12/2021 1821   UROBILINOGEN 1.0 02/27/2014 1535   NITRITE NEGATIVE 01/12/2021 1821   LEUKOCYTESUR NEGATIVE 01/12/2021 1821    Radiological Exams on Admission: CT Abdomen Pelvis W Contrast  Result Date: 01/12/2021 CLINICAL DATA:  Right lower quadrant pain. EXAM: CT ABDOMEN AND PELVIS WITH CONTRAST TECHNIQUE: Multidetector CT imaging of the abdomen and pelvis was performed using the standard protocol following bolus administration of intravenous contrast. CONTRAST:  OMNIPAQUE IOHEXOL 300 MG/ML  SOLN COMPARISON:  CT abdomen and pelvis 02/27/2014. FINDINGS: Lower chest: No acute abnormality. Hepatobiliary: No focal liver abnormality is seen. No gallstones, gallbladder wall thickening, or biliary dilatation. Pancreas: Unremarkable. No pancreatic ductal dilatation or surrounding inflammatory changes. Spleen: Normal in size without focal abnormality. Adrenals/Urinary Tract: There are rounded cortical hypodensities in the right kidney which are too small to characterize, but likely cysts. Kidneys and adrenal glands are otherwise within normal limits. There is mild diffuse bladder wall thickening  versus normal under distension. There is no surrounding inflammation. Stomach/Bowel: Sigmoid colon diverticula are present. There is sigmoid colon wall thickening with surrounding inflammatory stranding and fluid. Posterior to the distal sigmoid colon there is an enhancing fluid collection containing air measuring 2.8 x 2.5 x 2.5 cm concerning for abscess. No free air identified. No bowel obstruction. Appendix within normal limits. Small bowel and stomach within normal limits. Vascular/Lymphatic: No significant vascular findings are present. No enlarged abdominal or pelvic lymph nodes. Reproductive: Prostate gland is mildly enlarged. Other: No ascites.  No focal abdominal wall hernia. Musculoskeletal: No acute or significant osseous findings. IMPRESSION: 1. Acute sigmoid colon diverticulitis. 2.8 cm adjacent extraluminal abscess. 2. Bladder wall thickening versus normal under distension. Correlate for cystitis. 3. Prostatomegaly. Electronically Signed   By: Mcneil Sober.D.  On: 01/12/2021 19:54    EKG: Not performed in the ED.  Assessment/Plan Principal Problem:   Acute diverticulitis Active Problems:   Pre-diabetes  Acute diverticulitis > Continue 4 days of abdominal pain.  Found to have acute diverticulitis on CT with evidence of a 2.8 cm extraluminal abscess. > Surgery was consulted at Recommend medicine admission and will consult. - Appreciate general surgery recommendations - Monitor on continuous pulse ox given pain control - Provide continuous IV fluids for now - N.p.o. (except sips with meds) - Pain control as needed Tylenol for mild pain and then Dilaudid for severe pain - Zofran as needed  Diabetes > Presumed new diagnosis of diabetes considering history of prediabetes for the past year and glucose elevated to 34 with urine glucose greater than thousand.  However this could be somewhat artificially elevated in the setting of his acute diverticulitis. - Follow-up A1c - SSI  DVT  prophylaxis: Lovenox  Code Status:   Full  Family Communication:  Wife updated at bedside  Disposition Plan:   Patient is from:  Home  Anticipated DC to:  Home  Anticipated DC date:  2 to 5 days  Anticipated DC barriers: None  Consults called:  General surgery consulted by EDP Admission status:  Inpatient, MedSurg   Severity of Illness: The appropriate patient status for this patient is INPATIENT. Inpatient status is judged to be reasonable and necessary in order to provide the required intensity of service to ensure the patient's safety. The patient's presenting symptoms, physical exam findings, and initial radiographic and laboratory data in the context of their chronic comorbidities is felt to place them at high risk for further clinical deterioration. Furthermore, it is not anticipated that the patient will be medically stable for discharge from the hospital within 2 midnights of admission.   * I certify that at the point of admission it is my clinical judgment that the patient will require inpatient hospital care spanning beyond 2 midnights from the point of admission due to high intensity of service, high risk for further deterioration and high frequency of surveillance required.Synetta Fail MD [facility]  How to contact the [facility] Attending or Consulting provider 7A - 7P or covering provider during after hours 7P -7A, for this patient?   Check the care team in [facility] and look for a) attending/consulting [facility] provider listed and b) the [facility] team listed Log into [email or web address] and use 's universal password to access. If you do not have the password, please contact the hospital operator. Locate the [facility] provider you are looking for under [facility] and page to a number that you can be directly reached. If you still have difficulty reaching the provider, please page the [facility] (Director on Call) for the Hospitalists listed on amion for assistance.  01/13/2021,  1:15 AM

## 2021-01-13 NOTE — Progress Notes (Signed)
Patient has arrived to the floor. Spanish speaking, understands some english. Alert, oriented, verbal, and able to ambulate.

## 2021-01-14 LAB — GLUCOSE, CAPILLARY
Glucose-Capillary: 135 mg/dL — ABNORMAL HIGH (ref 70–99)
Glucose-Capillary: 174 mg/dL — ABNORMAL HIGH (ref 70–99)

## 2021-01-14 LAB — BASIC METABOLIC PANEL
Anion gap: 9 (ref 5–15)
BUN: 8 mg/dL (ref 6–20)
CO2: 25 mmol/L (ref 22–32)
Calcium: 8.3 mg/dL — ABNORMAL LOW (ref 8.9–10.3)
Chloride: 105 mmol/L (ref 98–111)
Creatinine, Ser: 0.7 mg/dL (ref 0.61–1.24)
GFR, Estimated: >60 mL/min (ref >60)
Glucose, Bld: 137 mg/dL — ABNORMAL HIGH (ref 70–99)
Potassium: 3.2 mmol/L — ABNORMAL LOW (ref 3.5–5.1)
Sodium: 139 mmol/L (ref 135–145)

## 2021-01-14 MED ORDER — CEPHALEXIN 250 MG PO CAPS: 250.0000 mg | ORAL_CAPSULE | Freq: Four times a day (QID) | ORAL | 0 refills | Status: DC

## 2021-01-14 MED ORDER — CEPHALEXIN 250 MG PO CAPS: 250.0000 mg | ORAL_CAPSULE | Freq: Four times a day (QID) | ORAL | 0 refills | Status: AC

## 2021-01-14 MED ORDER — METRONIDAZOLE 500 MG PO TABS: 500.0000 mg | ORAL_TABLET | Freq: Three times a day (TID) | ORAL | 0 refills | Status: DC

## 2021-01-14 MED ORDER — METRONIDAZOLE 500 MG PO TABS: 500.0000 mg | ORAL_TABLET | Freq: Three times a day (TID) | ORAL | 0 refills | Status: AC

## 2021-01-14 MED ORDER — HYDROCODONE-ACETAMINOPHEN 5-325 MG PO TABS: 1.0000 | ORAL_TABLET | ORAL | 0 refills | Status: AC | PRN

## 2021-01-14 NOTE — Progress Notes (Signed)
Subjective: CC: Patient reports no abdominal pain this am. Having some pressure/pain in his rectum. Only took tylenol for pain yesterday. Tolerating cld without n/v. Passing flatus.   Objective: Vital signs in last 24 hours: Temp:  [97.3 F (36.3 C)-98.9 F (37.2 C)] 98.7 F (37.1 C) (10/21 0532) Pulse Rate:  [66-71] 71 (10/21 0532) Resp:  [18-20] 18 (10/21 0532) BP: (119-142)/(79-97) 123/88 (10/21 0532) SpO2:  [99 %-100 %] 100 % (10/21 0532) Last BM Date: 01/11/21  Intake/Output from previous day: 10/20 0701 - 10/21 0700 In: 1650 [P.O.:600; I.V.:1050] Out: 2625 [Urine:2625] Intake/Output this shift: Total I/O In: -  Out: 375 [Urine:375]  PE: Gen:  Alert, NAD, pleasant Card:  RRR Pulm:  CTAB, no W/R/R, effort normal Abd: Soft, ND, very mild suprapubic tenderness that is improved from yesterday and without peritonitis. +BS Ext:  No LE edema or calf tenderness Psych: A&Ox3  Skin: no rashes noted, warm and dry  Lab Results:  Recent Labs    01/12/21 1821 01/13/21 0426  WBC 9.0 7.4  HGB 15.7 14.2  HCT 44.3 41.1  PLT 191 167   BMET Recent Labs    01/13/21 0426 01/14/21 0413  NA 137 139  K 3.1* 3.2*  CL 104 105  CO2 28 25  GLUCOSE 191* 137*  BUN 13 8  CREATININE 0.69 0.70  CALCIUM 8.3* 8.3*   PT/INR No results for input(s): LABPROT, INR in the last 72 hours. CMP     Component Value Date/Time   NA 139 01/14/2021 0413   K 3.2 (L) 01/14/2021 0413   CL 105 01/14/2021 0413   CO2 25 01/14/2021 0413   GLUCOSE 137 (H) 01/14/2021 0413   BUN 8 01/14/2021 0413   CREATININE 0.70 01/14/2021 0413   CALCIUM 8.3 (L) 01/14/2021 0413   PROT 6.6 01/13/2021 0426   ALBUMIN 3.6 01/13/2021 0426   AST 26 01/13/2021 0426   ALT 37 01/13/2021 0426   ALKPHOS 70 01/13/2021 0426   BILITOT 1.3 (H) 01/13/2021 0426   GFRNONAA >60 01/14/2021 0413   Lipase     Component Value Date/Time   LIPASE 40 01/12/2021 1821    Studies/Results: CT Abdomen Pelvis W  Contrast  Result Date: 01/12/2021 CLINICAL DATA:  Right lower quadrant pain. EXAM: CT ABDOMEN AND PELVIS WITH CONTRAST TECHNIQUE: Multidetector CT imaging of the abdomen and pelvis was performed using the standard protocol following bolus administration of intravenous contrast. CONTRAST:  OMNIPAQUE IOHEXOL 300 MG/ML  SOLN COMPARISON:  CT abdomen and pelvis 02/27/2014. FINDINGS: Lower chest: No acute abnormality. Hepatobiliary: No focal liver abnormality is seen. No gallstones, gallbladder wall thickening, or biliary dilatation. Pancreas: Unremarkable. No pancreatic ductal dilatation or surrounding inflammatory changes. Spleen: Normal in size without focal abnormality. Adrenals/Urinary Tract: There are rounded cortical hypodensities in the right kidney which are too small to characterize, but likely cysts. Kidneys and adrenal glands are otherwise within normal limits. There is mild diffuse bladder wall thickening versus normal under distension. There is no surrounding inflammation. Stomach/Bowel: Sigmoid colon diverticula are present. There is sigmoid colon wall thickening with surrounding inflammatory stranding and fluid. Posterior to the distal sigmoid colon there is an enhancing fluid collection containing air measuring 2.8 x 2.5 x 2.5 cm concerning for abscess. No free air identified. No bowel obstruction. Appendix within normal limits. Small bowel and stomach within normal limits. Vascular/Lymphatic: No significant vascular findings are present. No enlarged abdominal or pelvic lymph nodes. Reproductive: Prostate gland is mildly enlarged. Other:  No ascites.  No focal abdominal wall hernia. Musculoskeletal: No acute or significant osseous findings. IMPRESSION: 1. Acute sigmoid colon diverticulitis. 2.8 cm adjacent extraluminal abscess. 2. Bladder wall thickening versus normal under distension. Correlate for cystitis. 3. Prostatomegaly. Electronically Signed   By: Darliss Cheney M.D.   On: 01/12/2021 19:54     Anti-infectives: Anti-infectives (From admission, onward)    Start     Dose/Rate Route Frequency Ordered Stop   01/13/21 2100  cefTRIAXone (ROCEPHIN) 2 g in sodium chloride 0.9 % 100 mL IVPB        2 g 200 mL/hr over 30 Minutes Intravenous Every 24 hours 01/13/21 0027     01/13/21 0900  metroNIDAZOLE (FLAGYL) IVPB 500 mg        500 mg 100 mL/hr over 60 Minutes Intravenous Every 12 hours 01/13/21 0027     01/12/21 2115  cefTRIAXone (ROCEPHIN) 2 g in sodium chloride 0.9 % 100 mL IVPB       See Hyperspace for full Linked Orders Report.   2 g 200 mL/hr over 30 Minutes Intravenous  Once 01/12/21 2109 01/12/21 2215   01/12/21 2115  metroNIDAZOLE (FLAGYL) IVPB 500 mg       See Hyperspace for full Linked Orders Report.   500 mg 100 mL/hr over 60 Minutes Intravenous  Once 01/12/21 2109 01/12/21 2315        Assessment/Plan Sigmoid diverticulitis w/ abscess - CT 10/19 w/ sigmoid diverticulitis with a 2.8 cm adjacent extraluminal abscess. Not amenable to IR drainage.  - WBC wnl on yesterday's labs. Tolerating cld and having bowel function. Pain and tenderness improved. Adv to FLD w/ adv as tolerated to soft diet - Okay for d/c from our standpoint if tolerates fld. Cont abx for 10d total. Can switch to Augmentin - Would recommend a colonoscopy in 4-6 weeks.     FEN - FLD, AAT to Soft VTE - SCDs, Lovenox ID - Rocephin/Flagyl    LOS: 2 days    Jacinto Halim , [facility] Surgery 01/14/2021, 8:27 AM Please see Amion for pager number during day hours 7:00am-4:30pm

## 2021-01-14 NOTE — Discharge Summary (Signed)
Physician Discharge Summary  [place] DPO:[id number] DOB: 01/02/1976 DOA: 01/12/2021  PCP: Mila Palmer, MD  Admit date: 01/12/2021 Discharge date: 01/14/2021  Admitted From: Home Disposition: Home  Recommendations for Outpatient Follow-up:  Follow up with PCP in 1-2 weeks Please obtain BMP/CBC in one week Please follow up on the following pending results:  Home Health: None Equipment/Devices: None  Discharge Condition: Stable CODE STATUS: Full Diet recommendation: Full liquid/soft diet for the next 5 to 7 days slowly transition back to regular diet as discussed  Brief/Interim Summary: Christopher Baldwin is a 45 y.o. male with a history of obesity and an episode of diverticulitis who presented to the ED 10/19 with abdominal pain found to have acute sigmoid diverticulitis with 2.8cm extraluminal abscess. IV antibiotics and fluids were started and the patient was admitted to medicine with surgical consultation early this morning by Dr. Alinda Money.  Given positioning of patient's abscess and size surgery recommending conservative treatment with ongoing antibiotics and full liquid and soft diet with outpatient follow-up as scheduled.  We will continue antibiotics as below for additional 7 days, planned outpatient follow-up with PCP/surgery as scheduled.  Acute sigmoid diverticulitis complicated by small (2.8cm) abscess:  - Continue antibiotics -Currently tolerating p.o. quite well -Colonoscopy in 6 weeks   Diabetes, newly diagnosed -Uncontrolled with hyperglycemia -A1c 9.9 -Lengthy discussion about need for dietary improvement, follow-up with PCP for further recommendations, may require p.o. treatment or insulin if he does not improve his dietary intake as discussed   Discharge Instructions  Discharge Instructions     Call MD for:  persistant nausea and vomiting   Complete by: As directed    Call MD for:  persistant nausea and vomiting   Complete by: As directed    Call MD for:   severe uncontrolled pain   Complete by: As directed    Call MD for:  severe uncontrolled pain   Complete by: As directed    Call MD for:  temperature >100.4   Complete by: As directed    Call MD for:  temperature >100.4   Complete by: As directed    Diet - low sodium heart healthy   Complete by: As directed    Increase activity slowly   Complete by: As directed    Increase activity slowly   Complete by: As directed       Allergies as of 01/14/2021   No Known Allergies      Medication List     STOP taking these medications    lamoTRIgine 25 MG tablet Commonly known as: LAMICTAL       TAKE these medications    cephALEXin 250 MG capsule Commonly known as: KEFLEX Take 1 capsule (250 mg total) by mouth 4 (four) times daily for 7 days.   HYDROcodone-acetaminophen 5-325 MG tablet Commonly known as: NORCO/VICODIN Take 1 tablet by mouth every 4 (four) hours as needed for moderate pain.   ibuprofen 800 MG tablet Commonly known as: ADVIL Take 800 mg by mouth every 8 (eight) hours as needed for moderate pain.   metroNIDAZOLE 500 MG tablet Commonly known as: Flagyl Take 1 tablet (500 mg total) by mouth 3 (three) times daily for 7 days.        No Known Allergies  Consultations: Gen Sx  Procedures/Studies: CT Abdomen Pelvis W Contrast  Result Date: 01/12/2021 CLINICAL DATA:  Right lower quadrant pain. EXAM: CT ABDOMEN AND PELVIS WITH CONTRAST TECHNIQUE: Multidetector CT imaging of the abdomen and pelvis was performed using the  standard protocol following bolus administration of intravenous contrast. CONTRAST:  OMNIPAQUE IOHEXOL 300 MG/ML  SOLN COMPARISON:  CT abdomen and pelvis 02/27/2014. FINDINGS: Lower chest: No acute abnormality. Hepatobiliary: No focal liver abnormality is seen. No gallstones, gallbladder wall thickening, or biliary dilatation. Pancreas: Unremarkable. No pancreatic ductal dilatation or surrounding inflammatory changes. Spleen: Normal in  size without focal abnormality. Adrenals/Urinary Tract: There are rounded cortical hypodensities in the right kidney which are too small to characterize, but likely cysts. Kidneys and adrenal glands are otherwise within normal limits. There is mild diffuse bladder wall thickening versus normal under distension. There is no surrounding inflammation. Stomach/Bowel: Sigmoid colon diverticula are present. There is sigmoid colon wall thickening with surrounding inflammatory stranding and fluid. Posterior to the distal sigmoid colon there is an enhancing fluid collection containing air measuring 2.8 x 2.5 x 2.5 cm concerning for abscess. No free air identified. No bowel obstruction. Appendix within normal limits. Small bowel and stomach within normal limits. Vascular/Lymphatic: No significant vascular findings are present. No enlarged abdominal or pelvic lymph nodes. Reproductive: Prostate gland is mildly enlarged. Other: No ascites.  No focal abdominal wall hernia. Musculoskeletal: No acute or significant osseous findings. IMPRESSION: 1. Acute sigmoid colon diverticulitis. 2.8 cm adjacent extraluminal abscess. 2. Bladder wall thickening versus normal under distension. Correlate for cystitis. 3. Prostatomegaly. Electronically Signed   By: Darliss Cheney M.D.   On: 01/12/2021 19:54     Subjective: No acute issues or events overnight denies nausea vomiting diarrhea constipation headache fevers chills or chest pain   Discharge Exam: Vitals:   01/14/21 0532 01/14/21 1348  BP: 123/88 (!) 137/91  Pulse: 71 80  Resp: 18 16  Temp: 98.7 F (37.1 C) 99.2 F (37.3 C)  SpO2: 100% 100%   Vitals:   01/13/21 1526 01/13/21 2122 01/14/21 0532 01/14/21 1348  BP: (!) 142/97 123/83 123/88 (!) 137/91  Pulse: 68 70 71 80  Resp: 18 18 18 16   Temp: (!) 97.3 F (36.3 C) 98.9 F (37.2 C) 98.7 F (37.1 C) 99.2 F (37.3 C)  TempSrc: Oral Oral Oral Oral  SpO2: 100% 100% 100% 100%  Weight:      Height:        General:  Pt is alert, awake, not in acute distress Cardiovascular: RRR, S1/S2 +, no rubs, no gallops Respiratory: CTA bilaterally, no wheezing, no rhonchi Abdominal: Soft, NT, ND, bowel sounds + Extremities: no edema, no cyanosis    The results of significant diagnostics from this hospitalization (including imaging, microbiology, ancillary and laboratory) are listed below for reference.     Microbiology: Recent Results (from the past 240 hour(s))  Resp Panel by RT-PCR (Flu A&B, Covid) Nasopharyngeal Swab     Status: None   Collection Time: 01/12/21  9:40 PM   Specimen: Nasopharyngeal Swab; Nasopharyngeal(NP) swabs in vial transport medium  Result Value Ref Range Status   SARS Coronavirus 2 by RT PCR NEGATIVE NEGATIVE Final    Comment: (NOTE) SARS-CoV-2 target nucleic acids are NOT DETECTED.  The SARS-CoV-2 RNA is generally detectable in upper respiratory specimens during the acute phase of infection. The lowest concentration of SARS-CoV-2 viral copies this assay can detect is 138 copies/mL. A negative result does not preclude SARS-Cov-2 infection and should not be used as the sole basis for treatment or other patient management decisions. A negative result may occur with  improper specimen collection/handling, submission of specimen other than nasopharyngeal swab, presence of viral mutation(s) within the areas targeted by this assay, and  inadequate number of viral copies(<138 copies/mL). A negative result must be combined with clinical observations, patient history, and epidemiological information. The expected result is Negative.  Fact Sheet for Patients:  BloggerCourse.com  Fact Sheet for Healthcare Providers:  SeriousBroker.it  This test is no t yet approved or cleared by the [place] FDA and  has been authorized for detection and/or diagnosis of SARS-CoV-2 by FDA under an Emergency Use Authorization (EUA). This EUA will remain   in effect (meaning this test can be used) for the duration of the COVID-19 declaration under Section 564(b)(1) of the Act, 21 U.S.C.section 360bbb-3(b)(1), unless the authorization is terminated  or revoked sooner.       Influenza A by PCR NEGATIVE NEGATIVE Final   Influenza B by PCR NEGATIVE NEGATIVE Final    Comment: (NOTE) The Xpert Xpress SARS-CoV-2/FLU/RSV plus assay is intended as an aid in the diagnosis of influenza from Nasopharyngeal swab specimens and should not be used as a sole basis for treatment. Nasal washings and aspirates are unacceptable for Xpert Xpress SARS-CoV-2/FLU/RSV testing.  Fact Sheet for Patients: BloggerCourse.com  Fact Sheet for Healthcare Providers: SeriousBroker.it  This test is not yet approved or cleared by the [place] FDA and has been authorized for detection and/or diagnosis of SARS-CoV-2 by FDA under an Emergency Use Authorization (EUA). This EUA will remain in effect (meaning this test can be used) for the duration of the COVID-19 declaration under Section 564(b)(1) of the Act, 21 U.S.C. section 360bbb-3(b)(1), unless the authorization is terminated or revoked.  Performed at [facility], [address] [place], [place] [address]      Labs: BNP (last 3 results) No results for input(s): BNP in the last 8760 hours. Basic Metabolic Panel: Recent Labs  Lab 01/12/21 1821 01/13/21 0426 01/14/21 0413  NA 137 137 139  K 3.4* 3.1* 3.2*  CL 102 104 105  CO2 27 28 25   GLUCOSE 234* 191* 137*  BUN 15 13 8   CREATININE 0.68 0.69 0.70  CALCIUM 9.2 8.3* 8.3*   Liver Function Tests: Recent Labs  Lab 01/12/21 1821 01/13/21 0426  AST 29 26  ALT 40 37  ALKPHOS 83 70  BILITOT 1.4* 1.3*  PROT 7.4 6.6  ALBUMIN 4.3 3.6   Recent Labs  Lab 01/12/21 1821  LIPASE 40   No results for input(s): AMMONIA in the last 168 hours. CBC: Recent Labs  Lab 01/12/21 1821  01/13/21 0426  WBC 9.0 7.4  HGB 15.7 14.2  HCT 44.3 41.1  MCV 87.9 90.9  PLT 191 167   Cardiac Enzymes: No results for input(s): CKTOTAL, CKMB, CKMBINDEX, TROPONINI in the last 168 hours. BNP: Invalid input(s): POCBNP CBG: Recent Labs  Lab 01/13/21 1153 01/13/21 1641 01/13/21 2127 01/14/21 0744 01/14/21 1112  GLUCAP 158* 196* 100* 135* 174*   D-Dimer No results for input(s): DDIMER in the last 72 hours. Hgb A1c Recent Labs    01/12/21 1821  HGBA1C 9.9*   Lipid Profile No results for input(s): CHOL, HDL, LDLCALC, TRIG, CHOLHDL, LDLDIRECT in the last 72 hours. Thyroid function studies No results for input(s): TSH, T4TOTAL, T3FREE, THYROIDAB in the last 72 hours.  Invalid input(s): FREET3 Anemia work up No results for input(s): VITAMINB12, FOLATE, FERRITIN, TIBC, IRON, RETICCTPCT in the last 72 hours. Urinalysis    Component Value Date/Time   COLORURINE YELLOW 01/12/2021 1821   APPEARANCEUR CLEAR 01/12/2021 1821   LABSPEC 1.026 01/12/2021 1821   PHURINE 5.5 01/12/2021 1821   GLUCOSEU >1,000 (A)  01/12/2021 1821   HGBUR NEGATIVE 01/12/2021 1821   BILIRUBINUR NEGATIVE 01/12/2021 1821   KETONESUR NEGATIVE 01/12/2021 1821   PROTEINUR NEGATIVE 01/12/2021 1821   UROBILINOGEN 1.0 02/27/2014 1535   NITRITE NEGATIVE 01/12/2021 1821   LEUKOCYTESUR NEGATIVE 01/12/2021 1821   Sepsis Labs Invalid input(s): PROCALCITONIN,  WBC,  LACTICIDVEN Microbiology Recent Results (from the past 240 hour(s))  Resp Panel by RT-PCR (Flu A&B, Covid) Nasopharyngeal Swab     Status: None   Collection Time: 01/12/21  9:40 PM   Specimen: Nasopharyngeal Swab; Nasopharyngeal(NP) swabs in vial transport medium  Result Value Ref Range Status   SARS Coronavirus 2 by RT PCR NEGATIVE NEGATIVE Final    Comment: (NOTE) SARS-CoV-2 target nucleic acids are NOT DETECTED.  The SARS-CoV-2 RNA is generally detectable in upper respiratory specimens during the acute phase of infection. The  lowest concentration of SARS-CoV-2 viral copies this assay can detect is 138 copies/mL. A negative result does not preclude SARS-Cov-2 infection and should not be used as the sole basis for treatment or other patient management decisions. A negative result may occur with  improper specimen collection/handling, submission of specimen other than nasopharyngeal swab, presence of viral mutation(s) within the areas targeted by this assay, and inadequate number of viral copies(<138 copies/mL). A negative result must be combined with clinical observations, patient history, and epidemiological information. The expected result is Negative.  Fact Sheet for Patients:  BloggerCourse.com  Fact Sheet for Healthcare Providers:  SeriousBroker.it  This test is no t yet approved or cleared by the [place] FDA and  has been authorized for detection and/or diagnosis of SARS-CoV-2 by FDA under an Emergency Use Authorization (EUA). This EUA will remain  in effect (meaning this test can be used) for the duration of the COVID-19 declaration under Section 564(b)(1) of the Act, 21 U.S.C.section 360bbb-3(b)(1), unless the authorization is terminated  or revoked sooner.       Influenza A by PCR NEGATIVE NEGATIVE Final   Influenza B by PCR NEGATIVE NEGATIVE Final    Comment: (NOTE) The Xpert Xpress SARS-CoV-2/FLU/RSV plus assay is intended as an aid in the diagnosis of influenza from Nasopharyngeal swab specimens and should not be used as a sole basis for treatment. Nasal washings and aspirates are unacceptable for Xpert Xpress SARS-CoV-2/FLU/RSV testing.  Fact Sheet for Patients: BloggerCourse.com  Fact Sheet for Healthcare Providers: SeriousBroker.it  This test is not yet approved or cleared by the [place] FDA and has been authorized for detection and/or diagnosis of SARS-CoV-2 by FDA under  an Emergency Use Authorization (EUA). This EUA will remain in effect (meaning this test can be used) for the duration of the COVID-19 declaration under Section 564(b)(1) of the Act, 21 U.S.C. section 360bbb-3(b)(1), unless the authorization is terminated or revoked.  Performed at [facility], [address], [place], [place] [address]      Time coordinating discharge: Over 30 minutes  SIGNED:   Azucena Fallen, DO [facility] 01/14/2021, 2:25 PM Pager   If 7PM-7AM, please contact night-coverage [email or web address]

## 2021-01-14 NOTE — Progress Notes (Signed)
Pt alert and oriented, tolerating diet. D/C instructions given and pt d/cd home. 

## 2023-09-12 ENCOUNTER — Other Ambulatory Visit: Payer: Self-pay | Admitting: Family Medicine

## 2023-09-12 ENCOUNTER — Ambulatory Visit
Admission: RE | Admit: 2023-09-12 | Discharge: 2023-09-12 | Disposition: A | Discharge status: Home / Self Care | Source: Ambulatory Visit | Attending: Family Medicine | Admitting: Family Medicine

## 2023-09-12 DIAGNOSIS — S6991XA Unspecified injury of right wrist, hand and finger(s), initial encounter: Secondary | ICD-10-CM
